# Patient Record
Sex: Male | Born: 1985 | Hispanic: Yes | Marital: Single | Smoking: Light tobacco smoker
Health system: Southern US, Community
[De-identification: ages and names within clinical notes are randomized; demographics above are authoritative.]

## PROBLEM LIST (undated history)

## (undated) DIAGNOSIS — F419 Anxiety disorder, unspecified: Secondary | ICD-10-CM

## (undated) DIAGNOSIS — F32A Depression, unspecified: Secondary | ICD-10-CM

## (undated) DIAGNOSIS — Z789 Other specified health status: Secondary | ICD-10-CM

## (undated) HISTORY — PX: OTHER SURGICAL HISTORY: SHX169

## (undated) HISTORY — DX: Other specified health status: Z78.9

---

## 2019-03-17 ENCOUNTER — Ambulatory Visit (INDEPENDENT_AMBULATORY_CARE_PROVIDER_SITE_OTHER): Payer: Self-pay | Admitting: Pharmacist

## 2019-03-17 ENCOUNTER — Telehealth: Payer: Self-pay | Admitting: Pharmacy Technician

## 2019-03-17 ENCOUNTER — Other Ambulatory Visit: Payer: Self-pay

## 2019-03-17 DIAGNOSIS — Z7252 High risk homosexual behavior: Secondary | ICD-10-CM

## 2019-03-17 NOTE — Telephone Encounter (Signed)
RC[facility] Patient Teacher, English as a foreign language completed.    The patient in uninsured and will need patient assistance for medication.  We can complete the application and will need to meet with the patient for signatures and income documentation.  Dylan Hurst. [name] Patient [facility] for Infectious Disease Phone: [phone] Fax:  [phone]

## 2019-03-17 NOTE — Progress Notes (Addendum)
Date:  03/17/2019   HPI: Dylan Hurst is a 33 y.o. male who presents to the [place] clinic to discuss and initiate PrEP.  Insured   '[]'    Uninsured  '[x]'    There are no active problems to display for this patient.   Patient's Medications   No medications on file    Allergies: Allergies not on file  Past Medical History: No past medical history on file.  Social History: Social History   Socioeconomic History  . Marital status: Single    Spouse name: Not on file  . Number of children: Not on file  . Years of education: Not on file  . Highest education level: Not on file  Occupational History  . Not on file  Social Needs  . Financial resource strain: Not on file  . Food insecurity:    Worry: Not on file    Inability: Not on file  . Transportation needs:    Medical: Not on file    Non-medical: Not on file  Tobacco Use  . Smoking status: Not on file  Substance and Sexual Activity  . Alcohol use: Not on file  . Drug use: Not on file  . Sexual activity: Not on file  Lifestyle  . Physical activity:    Days per week: Not on file    Minutes per session: Not on file  . Stress: Not on file  Relationships  . Social connections:    Talks on phone: Not on file    Gets together: Not on file    Attends religious service: Not on file    Active member of club or organization: Not on file    Attends meetings of clubs or organizations: Not on file    Relationship status: Not on file  Other Topics Concern  . Not on file  Social History Narrative  . Not on file    [facility] HIV PREP FLOWSHEET RESULTS 03/17/2019  Insurance Status Uninsured  How did you hear? Friends  Gender at birth Male  Gender identity cis-Male  Risk for HIV >5 partners in past 6 mos (regardless of condom use);Hx of STI;Condomless vaginal or anal intercourse  Sex Partners Men only  # sex partners past 3-6 mos >12  Sex activity preferences Receptive;Oral  Condom use Yes  % condom use 50  Treated  for STI? Yes  HIV symptoms? N/A  PrEP Eligibility Substantial risk for HIV    Labs:  SCr: No results found for: CREATININE HIV No results found for: HIV Hepatitis B No results found for: HEPBSAB, HEPBSAG, HEPBCAB Hepatitis C No results found for: HEPCAB, HCVRNAPCRQN Hepatitis A No results found for: HAV RPR and STI No results found for: LABRPR, RPRTITER  No flowsheet data found.  Assessment: Dylan Hurst is here today to discuss and initiate PrEP.  He states he knows little about PrEP at this time and heard about it from his friends.  He states that he sees the commercials on TV for Descovy. He is MSM who has had at least 20 partners in the last 6 months.  He only uses condoms about 50% of the time and is usually the receptive partner.  He engages in oral sex as well. He has been diagnosed with gonorrhea in the past but states that was about 10 years ago.  He has not been tested for gonorrhea since that time.  He also states that he bought an at-home kit to test for HIV a few months ago but before  then, he has not been tested in about 10 years. No history of IVDU or stimulant use.  No history of PEP.  He is not sure if any of his partners are HIV positive.  He definitely fits the criteria for needing PrEP since he is engaging in condomless receptive anal intercourse.  Will start Descovy for PrEP.  Counseled patient that Descovy is a one pill once daily regimen with or without food that can prevent HIV. Discussed the importance of taking the medication daily to provide protection and decreased adherence is associated with decreased efficacy. Also discussed how Descovy works to prevent HIV but not other STDs and encouraged the use of condoms. Counseled on what to do if dose is missed, if closer to missed dose take immediately, if closer to next dose then skip and resume normal schedule.  Counseled patient that Descovy is normally well tolerated, however some patients experience a "start up  syndrome" with nausea, diarrhea, dizziness, and fatigue but that those should resolve soon after starting.  Advised that any nausea can be mitigated by taking it with food. I reviewed patient medications and found no drug interactions. Discussed how our PrEP process works here at the clinic including follow ups and lab monitoring every 3 months.  He is interested in finding a PCP and someone who he can talk to about depression and anxiety.  He asked if Descovy would affect his mood and I told him that it should not.  He is not on any other medications at home and has no other diagnoses.  I will give him the contact information for [facility].  He is currently uninsured and not working.  I was able to get him a 30 day immediate approval from [place] and will send in the full application to get him approved for a year.  Will check all baseline labs today and start Descovy if he is HIV negative.  Will see him again in 3 months.  Plan: - HIV antibody, BMET, RPR, urine/oral/rectal gonorrhea/chlamydia cytology, hepatitis B surface antigen, hepatitis B surface antibody today - Fax application to [facility] for patient assistance - Descovy x 3 months if HIV negative - F/u with me 6/18 at [place]. Arionna Hoggard, PharmD, BCIDP, AAHIVP, [place] for Infectious Disease 03/17/2019, 3:01 PM

## 2019-03-17 NOTE — Patient Instructions (Signed)
[facility] [phone] 

## 2019-03-18 ENCOUNTER — Telehealth: Payer: Self-pay | Admitting: Pharmacy Technician

## 2019-03-18 ENCOUNTER — Telehealth: Payer: Self-pay | Admitting: Pharmacist

## 2019-03-18 DIAGNOSIS — Z7252 High risk homosexual behavior: Secondary | ICD-10-CM

## 2019-03-18 LAB — BASIC METABOLIC PANEL
BUN: 9 mg/dL (ref 7–25)
CHLORIDE: 105 mmol/L (ref 98–110)
CO2: 26 mmol/L (ref 20–32)
Calcium: 9.7 mg/dL (ref 8.6–10.3)
Creat: 0.97 mg/dL (ref 0.60–1.35)
Glucose, Bld: 81 mg/dL (ref 65–99)
POTASSIUM: 4.1 mmol/L (ref 3.5–5.3)
Sodium: 139 mmol/L (ref 135–146)

## 2019-03-18 LAB — URINE CYTOLOGY ANCILLARY ONLY
Chlamydia: NEGATIVE
Neisseria Gonorrhea: NEGATIVE

## 2019-03-18 LAB — HEPATITIS B SURFACE ANTIGEN: Hepatitis B Surface Ag: NONREACTIVE

## 2019-03-18 LAB — HIV ANTIBODY (ROUTINE TESTING W REFLEX): HIV 1&2 Ab, 4th Generation: NONREACTIVE

## 2019-03-18 LAB — CYTOLOGY, (ORAL, ANAL, URETHRAL) ANCILLARY ONLY
Chlamydia: NEGATIVE
Chlamydia: NEGATIVE
Neisseria Gonorrhea: NEGATIVE
Neisseria Gonorrhea: NEGATIVE

## 2019-03-18 LAB — HEPATITIS B SURFACE ANTIBODY,QUALITATIVE: Hep B S Ab: REACTIVE — AB

## 2019-03-18 LAB — RPR: RPR Ser Ql: NONREACTIVE

## 2019-03-18 MED ORDER — EMTRICITABINE-TENOFOVIR AF 200-25 MG PO TABS: 1.0000 | ORAL_TABLET | Freq: Every day | ORAL | 2 refills | Status: DC

## 2019-03-18 NOTE — Telephone Encounter (Signed)
Called patient to let him know that his HIV antibody was negative.  Will send in 3 months of Descovy and [facility] will mail it to his house.

## 2019-03-18 NOTE — Telephone Encounter (Signed)
[facility] Patient Advocate Encounter  Completed and sent [employer] Advancing Access application for Descovy for this patient who is uninsured.    Patient is approved 03/17/2019 through 03/16/2020.  BIN      [id number] PCN    [id number] GRP    [id number] ID        [id number]   Dylan Hurst Patient [facility] for Infectious Disease Phone: [phone] Fax:  [phone]

## 2019-06-10 ENCOUNTER — Ambulatory Visit (INDEPENDENT_AMBULATORY_CARE_PROVIDER_SITE_OTHER): Payer: Self-pay | Admitting: Pharmacist

## 2019-06-10 ENCOUNTER — Other Ambulatory Visit: Payer: Self-pay

## 2019-06-10 DIAGNOSIS — Z7252 High risk homosexual behavior: Secondary | ICD-10-CM

## 2019-06-10 NOTE — Progress Notes (Signed)
Date:  06/10/2019   HPI: Dylan Hurst is a 33 y.o. male who presents to the [place] clinic for 3 month PrEP follow-up.  Insured   []    Uninsured  [x]    There are no active problems to display for this patient.   Patient's Medications  New Prescriptions   No medications on file  Previous Medications   EMTRICITABINE-TENOFOVIR AF (DESCOVY) 200-25 MG TABLET    Take 1 tablet by mouth daily.  Modified Medications   No medications on file  Discontinued Medications   No medications on file    Allergies: Not on File  Past Medical History: No past medical history on file.  Social History: Social History   Socioeconomic History  . Marital status: Single    Spouse name: Not on file  . Number of children: Not on file  . Years of education: Not on file  . Highest education level: Not on file  Occupational History  . Not on file  Social Needs  . Financial resource strain: Not on file  . Food insecurity    Worry: Not on file    Inability: Not on file  . Transportation needs    Medical: Not on file    Non-medical: Not on file  Tobacco Use  . Smoking status: Not on file  Substance and Sexual Activity  . Alcohol use: Not on file  . Drug use: Not on file  . Sexual activity: Not on file  Lifestyle  . Physical activity    Days per week: Not on file    Minutes per session: Not on file  . Stress: Not on file  Relationships  . Social Herbalist on phone: Not on file    Gets together: Not on file    Attends religious service: Not on file    Active member of club or organization: Not on file    Attends meetings of clubs or organizations: Not on file    Relationship status: Not on file  Other Topics Concern  . Not on file  Social History Narrative  . Not on file    [facility] HIV PREP FLOWSHEET RESULTS 06/10/2019 03/17/2019  Insurance Status Uninsured Uninsured  How did you hear? - Friends  Gender at birth Male Male  Gender identity cis-Male cis-Male  Risk  for HIV - >5 partners in past 6 mos (regardless of condom use);Hx of STI;Condomless vaginal or anal intercourse  Sex Partners Men only Men only  # sex partners past 3-6 mos 1-3 >12  Sex activity preferences Insertive and receptive;Oral Receptive;Oral  Condom use No Yes  % condom use - 50  Treated for STI? No Yes  HIV symptoms? N/A N/A  PrEP Eligibility Substantial risk for HIV Substantial risk for HIV    Labs:  SCr: Lab Results  Component Value Date   CREATININE 0.97 03/17/2019   HIV Lab Results  Component Value Date   HIV NON-REACTIVE 03/17/2019   Hepatitis B Lab Results  Component Value Date   HEPBSAB REACTIVE (A) 03/17/2019   HEPBSAG NON-REACTIVE 03/17/2019   Hepatitis C No results found for: HEPCAB, HCVRNAPCRQN Hepatitis A No results found for: HAV RPR and STI Lab Results  Component Value Date   LABRPR NON-REACTIVE 03/17/2019    STI Results GC CT  03/17/2019 Negative Negative  03/17/2019 Negative Negative  03/17/2019 Negative Negative    Assessment: Dylan Hurst is here today for his 3 month PrEP follow-up and lab appointment. He started Descovy back  in March and has tolerated it very well.  He had questions on whether it was working or not due to him not feeling any different.  I told him that was a good thing that he was tolerating it without issues.  He has only missed one dose since starting 3 months ago and takes it around the same time every day. He remains uninsured and has no issues getting it from [facility]  He has only had 2 partners (previously 20+) in the last 3 months with no condom use. They are not new partners and they get tested as well. No issues or concerns for STDs or acute HIV today. Will check a HIV antibody today and see him back in 3 months.  Plan: - HIV antibody only - Descovy x 3 months if HIV negative - F/u with me again 9/16 at 230pm  Cassie L. Kuppelweiser, PharmD, BCIDP, AAHIVP, [place] for Infectious Disease 06/10/2019, 2:48 PM

## 2019-06-10 NOTE — Patient Instructions (Signed)
[facility] [phone]

## 2019-06-11 ENCOUNTER — Telehealth: Payer: Self-pay | Admitting: Pharmacist

## 2019-06-11 ENCOUNTER — Ambulatory Visit: Payer: Self-pay | Admitting: Pharmacist

## 2019-06-11 ENCOUNTER — Other Ambulatory Visit: Payer: Self-pay | Admitting: Pharmacist

## 2019-06-11 DIAGNOSIS — Z7252 High risk homosexual behavior: Secondary | ICD-10-CM

## 2019-06-11 LAB — HIV ANTIBODY (ROUTINE TESTING W REFLEX): HIV 1&2 Ab, 4th Generation: NONREACTIVE

## 2019-06-11 MED ORDER — DESCOVY 200-25 MG PO TABS: 1.0000 | ORAL_TABLET | Freq: Every day | ORAL | 2 refills | Status: DC

## 2019-06-11 NOTE — Telephone Encounter (Signed)
Called patient to let him know that his HIV antibody was negative - no answer and no VM set up.  Will send in 3 more months of Descovy. He will have it shipped from [facility].

## 2019-09-09 ENCOUNTER — Ambulatory Visit: Payer: Self-pay | Admitting: Pharmacist

## 2019-11-10 ENCOUNTER — Other Ambulatory Visit: Payer: Self-pay

## 2019-11-10 ENCOUNTER — Ambulatory Visit (INDEPENDENT_AMBULATORY_CARE_PROVIDER_SITE_OTHER): Payer: Self-pay | Admitting: Pharmacist

## 2019-11-10 DIAGNOSIS — Z7252 High risk homosexual behavior: Secondary | ICD-10-CM

## 2019-11-10 NOTE — Progress Notes (Signed)
Date:  11/10/2019   HPI: Dylan Hurst is a 33 y.o. male who presents to the [place] clinic for 3 month PrEP follow-up.  Insured   []    Uninsured  [x]    There are no active problems to display for this patient.   Patient's Medications  New Prescriptions   No medications on file  Previous Medications   EMTRICITABINE-TENOFOVIR AF (DESCOVY) 200-25 MG TABLET    Take 1 tablet by mouth daily.  Modified Medications   No medications on file  Discontinued Medications   No medications on file    Allergies: Not on File  Past Medical History: No past medical history on file.  Social History: Social History   Socioeconomic History  . Marital status: Single    Spouse name: Not on file  . Number of children: Not on file  . Years of education: Not on file  . Highest education level: Not on file  Occupational History  . Not on file  Social Needs  . Financial resource strain: Not on file  . Food insecurity    Worry: Not on file    Inability: Not on file  . Transportation needs    Medical: Not on file    Non-medical: Not on file  Tobacco Use  . Smoking status: Not on file  Substance and Sexual Activity  . Alcohol use: Not on file  . Drug use: Not on file  . Sexual activity: Not on file  Lifestyle  . Physical activity    Days per week: Not on file    Minutes per session: Not on file  . Stress: Not on file  Relationships  . Social Herbalist on phone: Not on file    Gets together: Not on file    Attends religious service: Not on file    Active member of club or organization: Not on file    Attends meetings of clubs or organizations: Not on file    Relationship status: Not on file  Other Topics Concern  . Not on file  Social History Narrative  . Not on file    [facility] HIV PREP FLOWSHEET RESULTS 06/10/2019 03/17/2019  Insurance Status Uninsured Uninsured  How did you hear? - Friends  Gender at birth Male Male  Gender identity cis-Male cis-Male  Risk  for HIV - >5 partners in past 6 mos (regardless of condom use);Hx of STI;Condomless vaginal or anal intercourse  Sex Partners Men only Men only  # sex partners past 3-6 mos 1-3 >12  Sex activity preferences Insertive and receptive;Oral Receptive;Oral  Condom use No Yes  % condom use - 50  Treated for STI? No Yes  HIV symptoms? N/A N/A  PrEP Eligibility Substantial risk for HIV Substantial risk for HIV    Labs:  SCr: Lab Results  Component Value Date   CREATININE 0.97 03/17/2019   HIV Lab Results  Component Value Date   HIV NON-REACTIVE 06/10/2019   HIV NON-REACTIVE 03/17/2019   Hepatitis B Lab Results  Component Value Date   HEPBSAB REACTIVE (A) 03/17/2019   HEPBSAG NON-REACTIVE 03/17/2019   Hepatitis C No results found for: HEPCAB, HCVRNAPCRQN Hepatitis A No results found for: HAV RPR and STI Lab Results  Component Value Date   LABRPR NON-REACTIVE 03/17/2019    STI Results GC CT  03/17/2019 Negative Negative  03/17/2019 Negative Negative  03/17/2019 Negative Negative    Assessment: Dylan Hurst is here in clinic today to follow up with PrEP after  being out of care for a few months. He was started on Descovy back in March of 2020 and says he has had no trouble taking the medication. He ran out of PrEP in October and has had trouble getting to his clinic appointments since so he has been off Dylan Hurst for a few months.   He has had 8 partners in the past 3-6 months and he has used protection since running out of his PrEP but was not always using condoms prior to that. I told him that we do recommend he use condoms all the time because although the Descovy does prevent HIV infection if he were to be exposed, it does not protect against STDs. He reports being the receptive partner in most of his recent sexual encounters but he has been insertive in the past. We will check a urine cytology if he can provide a sample, as well as oral and rectal cytology. I have scheduled his three  month follow up, and will give him a call tomorrow with the results of his HIV antibody.    Plan: - Continue Descovy if HIV antibody negative - RPR, BMET, HIV antibody, oral/rectal cytology - Follow up 02/05/2020   Nicoletta Dress, PharmD PGY2 Infectious Disease Pharmacy Resident  [place] for Infectious Disease 11/10/2019, 1:46 PM

## 2019-11-11 ENCOUNTER — Other Ambulatory Visit: Payer: Self-pay | Admitting: Pharmacist

## 2019-11-11 DIAGNOSIS — Z7252 High risk homosexual behavior: Secondary | ICD-10-CM

## 2019-11-11 LAB — CYTOLOGY, (ORAL, ANAL, URETHRAL) ANCILLARY ONLY
Chlamydia: NEGATIVE
Chlamydia: NEGATIVE
Comment: NEGATIVE
Comment: NEGATIVE
Comment: NORMAL
Comment: NORMAL
Neisseria Gonorrhea: NEGATIVE
Neisseria Gonorrhea: NEGATIVE

## 2019-11-11 LAB — BASIC METABOLIC PANEL
BUN: 10 mg/dL (ref 7–25)
CO2: 23 mmol/L (ref 20–32)
Calcium: 9.8 mg/dL (ref 8.6–10.3)
Chloride: 105 mmol/L (ref 98–110)
Creat: 0.92 mg/dL (ref 0.60–1.35)
Glucose, Bld: 100 mg/dL — ABNORMAL HIGH (ref 65–99)
Potassium: 4.1 mmol/L (ref 3.5–5.3)
Sodium: 139 mmol/L (ref 135–146)

## 2019-11-11 LAB — HIV ANTIBODY (ROUTINE TESTING W REFLEX): HIV 1&2 Ab, 4th Generation: NONREACTIVE

## 2019-11-11 LAB — RPR: RPR Ser Ql: NONREACTIVE

## 2019-11-11 MED ORDER — DESCOVY 200-25 MG PO TABS: 1.0000 | ORAL_TABLET | Freq: Every day | ORAL | 2 refills | Status: DC

## 2019-11-11 NOTE — Progress Notes (Unsigned)
Patients HIV antibody came back negative, will send in a refill for Descovy to [facility]

## 2020-01-05 ENCOUNTER — Other Ambulatory Visit: Payer: Self-pay | Admitting: Pharmacist

## 2020-01-05 DIAGNOSIS — Z7252 High risk homosexual behavior: Secondary | ICD-10-CM

## 2020-02-05 ENCOUNTER — Other Ambulatory Visit: Payer: Self-pay

## 2020-02-05 ENCOUNTER — Ambulatory Visit: Payer: Self-pay | Admitting: Pharmacist

## 2020-02-25 ENCOUNTER — Ambulatory Visit: Payer: Self-pay | Admitting: Pharmacist

## 2020-02-29 ENCOUNTER — Ambulatory Visit (INDEPENDENT_AMBULATORY_CARE_PROVIDER_SITE_OTHER): Payer: Self-pay | Admitting: Pharmacist

## 2020-02-29 ENCOUNTER — Other Ambulatory Visit: Payer: Self-pay

## 2020-02-29 DIAGNOSIS — Z79899 Other long term (current) drug therapy: Secondary | ICD-10-CM

## 2020-02-29 DIAGNOSIS — Z7252 High risk homosexual behavior: Secondary | ICD-10-CM

## 2020-02-29 NOTE — Patient Instructions (Addendum)
[place] [phone]

## 2020-02-29 NOTE — Progress Notes (Signed)
Date:  02/29/2020   HPI: Jasher Klym is a 34 y.o. male who presents to the [place] clinic for 3 month PrEP follow-up.  Insured   []    Uninsured  [x]    There are no problems to display for this patient.   Patient's Medications  New Prescriptions   No medications on file  Previous Medications   EMTRICITABINE-TENOFOVIR AF (DESCOVY) 200-25 MG TABLET    Take 1 tablet by mouth daily.  Modified Medications   No medications on file  Discontinued Medications   No medications on file    Allergies: Not on File  Past Medical History: No past medical history on file.  Social History: Social History   Socioeconomic History  . Marital status: Single    Spouse name: Not on file  . Number of children: Not on file  . Years of education: Not on file  . Highest education level: Not on file  Occupational History  . Not on file  Tobacco Use  . Smoking status: Not on file  Substance and Sexual Activity  . Alcohol use: Not on file  . Drug use: Not on file  . Sexual activity: Not on file  Other Topics Concern  . Not on file  Social History Narrative  . Not on file   Social Determinants of Health   Financial Resource Strain:   . Difficulty of Paying Living Expenses: Not on file  Food Insecurity:   . Worried About Charity fundraiser in the Last Year: Not on file  . Ran Out of Food in the Last Year: Not on file  Transportation Needs:   . Lack of Transportation (Medical): Not on file  . Lack of Transportation (Non-Medical): Not on file  Physical Activity:   . Days of Exercise per Week: Not on file  . Minutes of Exercise per Session: Not on file  Stress:   . Feeling of Stress : Not on file  Social Connections:   . Frequency of Communication with Friends and Family: Not on file  . Frequency of Social Gatherings with Friends and Family: Not on file  . Attends Religious Services: Not on file  . Active Member of Clubs or Organizations: Not on file  . Attends Theatre manager Meetings: Not on file  . Marital Status: Not on file    [facility] HIV PREP FLOWSHEET RESULTS 06/10/2019 03/17/2019  Insurance Status Uninsured Uninsured  How did you hear? - Friends  Gender at birth Male Male  Gender identity cis-Male cis-Male  Risk for HIV - >5 partners in past 6 mos (regardless of condom use);Hx of STI;Condomless vaginal or anal intercourse  Sex Partners Men only Men only  # sex partners past 3-6 mos 1-3 >12  Sex activity preferences Insertive and receptive;Oral Receptive;Oral  Condom use No Yes  % condom use - 50  Treated for STI? No Yes  HIV symptoms? N/A N/A  PrEP Eligibility Substantial risk for HIV Substantial risk for HIV    Labs:  SCr: Lab Results  Component Value Date   CREATININE 0.92 11/10/2019   CREATININE 0.97 03/17/2019   HIV Lab Results  Component Value Date   HIV NON-REACTIVE 11/10/2019   HIV NON-REACTIVE 06/10/2019   HIV NON-REACTIVE 03/17/2019   Hepatitis B Lab Results  Component Value Date   HEPBSAB REACTIVE (A) 03/17/2019   HEPBSAG NON-REACTIVE 03/17/2019   Hepatitis C No results found for: HEPCAB, HCVRNAPCRQN Hepatitis A No results found for: HAV RPR and STI Lab  Results  Component Value Date   LABRPR NON-REACTIVE 11/10/2019   LABRPR NON-REACTIVE 03/17/2019    STI Results GC CT  11/10/2019 Negative Negative  11/10/2019 Negative Negative  03/17/2019 Negative Negative  03/17/2019 Negative Negative  03/17/2019 Negative Negative    Assessment: CD is here today for Descovy  PrEP follow up. He mentions issues with currently untreated depression/anxiety and other social determinants of health (transportation, employment).   CD currently has 5 consistent sexual partners and does have unprotected sex but states he is not concerned for STI's at this time.   Plan: 1) HIV Ab test 2) 90 day refill for Descovy if HIV negative 3) F/U in 3 months in June 2021 4) Refer to [place] clinic for  depression/anxiety   Ned Clines Student Pharmacist, Class of [place] for Infectious Disease 02/29/2020, 3:06 PM

## 2020-03-01 ENCOUNTER — Other Ambulatory Visit: Payer: Self-pay | Admitting: Pharmacist

## 2020-03-01 DIAGNOSIS — Z7252 High risk homosexual behavior: Secondary | ICD-10-CM

## 2020-03-01 LAB — HIV ANTIBODY (ROUTINE TESTING W REFLEX): HIV 1&2 Ab, 4th Generation: NONREACTIVE

## 2020-03-01 MED ORDER — DESCOVY 200-25 MG PO TABS: 1.0000 | ORAL_TABLET | Freq: Every day | ORAL | 2 refills | Status: DC

## 2020-03-01 NOTE — Progress Notes (Signed)
Patient's HIV antibody is negative.  Will send in 3 more months of Descovy to [facility].  

## 2020-03-14 ENCOUNTER — Other Ambulatory Visit: Payer: Self-pay

## 2020-03-14 ENCOUNTER — Ambulatory Visit: Payer: Self-pay | Attending: Family Medicine | Admitting: Family Medicine

## 2020-03-14 ENCOUNTER — Encounter: Payer: Self-pay | Admitting: Family Medicine

## 2020-03-14 VITALS — Ht 66.0 in

## 2020-03-14 DIAGNOSIS — H5789 Other specified disorders of eye and adnexa: Secondary | ICD-10-CM

## 2020-03-14 DIAGNOSIS — G479 Sleep disorder, unspecified: Secondary | ICD-10-CM

## 2020-03-14 DIAGNOSIS — R4589 Other symptoms and signs involving emotional state: Secondary | ICD-10-CM

## 2020-03-14 DIAGNOSIS — F411 Generalized anxiety disorder: Secondary | ICD-10-CM

## 2020-03-14 MED ORDER — TRAZODONE HCL 50 MG PO TABS: 25.0000 mg | ORAL_TABLET | Freq: Every evening | ORAL | 2 refills | Status: DC | PRN

## 2020-03-14 MED ORDER — VENLAFAXINE HCL ER 75 MG PO CP24: 75.0000 mg | ORAL_CAPSULE | Freq: Every day | ORAL | 2 refills | Status: DC

## 2020-03-14 NOTE — Progress Notes (Signed)
Virtual Visit via Telephone Note  I connected with Dylan Hurst on 03/14/20 at  3:30 PM EDT by telephone and verified that I am speaking with the correct person using two identifiers.   I discussed the limitations, risks, security and privacy concerns of performing an evaluation and management service by telephone and the availability of in person appointments. I also discussed with the patient that there may be a patient responsible charge related to this service. The patient expressed understanding and agreed to proceed.  Patient Location: Home Provider Location: [facility] Others participating in call: none   History of Present Illness:          34 year old male new to the practice who reports that he has sensation of something being in his left eye.  He reports that his eye at times feels irritated.  He denies any drainage or redness of the eye.  Occasionally his eye feels watery.  He denies any changes in vision.  Upon further discussion, patient states that about 2 years ago he was hit in the eye with a soccer ball and since that time he has felt that there is an abnormal appearance as if the eyes still slightly swollen as well as the occasional sensation that something is in his eye.  He reports that he did have an evaluation and was told that there were no fractures or other abnormalities causing his abnormal eye sensation.           He also reports that he has had anxiety for about 5 years in which he would have difficulty being in crowded areas as this would cause him to feel anxious and sometimes he would just have onset of sensation of anxiety from no known source.  Over the past year, he has felt that he is getting the onset of depression as he has a lack of motivation to do anything such as cleaning up his room or doing chores and he finds that he mostly stays in his room during the day as he does not really want to interact with other people.  He does currently live with his mother.   He is also having issues with both falling asleep and staying asleep.  He reports that his sleep issues have also been somewhat longstanding.  He denies any current issues with headaches or dizziness, no changes in vision, no focal numbness or weakness, no chest pain or palpitations, no shortness of breath or cough.  No abdominal pain-no nausea/vomiting/diarrhea or constipation.  No suicidal thoughts or ideations.  He denies any past counseling or past use of medication to help with anxiety or depression but would be willing to start counseling as well as medication.          Past Medical History:  Diagnosis Date  . Known health problems: none     Past Surgical History:  Procedure Laterality Date  . no past surgery      Family History  Problem Relation Age of Onset  . Thyroid disease Mother   . Diabetes Father     Social History   Tobacco Use  . Smoking status: Never Smoker  . Smokeless tobacco: Never Used  Substance Use Topics  . Alcohol use: Not Currently  . Drug use: Yes    Types: Marijuana     No Known Allergies     Observations/Objective: No vital signs or physical exam conducted as visit was done via telephone  Assessment and Plan: 1. GAD (generalized anxiety disorder)  2. Depressed mood He reports that he has had anxiety for at least 5 years which consist of not wanting to be around large groups of people at avoiding situations that cause him to feel anxious however over the past year with the COVID-19 pandemic he feels that he has also developed some depression and lack of motivation.  He denies any suicidal thoughts or ideations.  He also has abnormal PHQ-9 score.  He agrees to start medication to help with anxiety/depression and lack of motivation, venlafaxine/Effexor 75 mg once daily and he is aware that he needs to take the medication early in the day so that it does not cause issues with his sleep.  He also agrees to be contacted by the medical social worker for  counseling and if needed to help set up ongoing counseling regarding his anxiety and depression.  He is made aware that it may take up to 4 weeks for him to feel as well on the medication as he is going to fail and he is encouraged to call or notify the office if he has any difficulty tolerating the medication.  He should seek follow-up at the emergency department if he has any acute worsening of anxiety or depression, suicidal thoughts or ideations or any other concerns. - venlafaxine XR (EFFEXOR-XR) 75 MG 24 hr capsule; Take 1 capsule (75 mg total) by mouth daily with breakfast.  Dispense: 30 capsule; Refill: 2 - Ambulatory referral to Social Work  3. Sensation of irritation of globe of eye Patient reports that he was previously hit in the eye by a soccer ball about 2 years ago and since that time he has recurrent abnormal sensation in his eye and also feels as if his eyes slightly enlarged.  He reports that he did have evaluation which did not find any facial fracture or other abnormality.  4. Difficulty sleeping He reports that he has difficulty falling asleep and staying asleep.  Will provide patient with prescription for trazodone 50 mg to take half or 1 pill at bedtime as needed to help with sleep. - traZODone (DESYREL) 50 MG tablet; Take 0.5-1 tablets (25-50 mg total) by mouth at bedtime as needed for sleep.  Dispense: 30 tablet; Refill: 2  Follow Up Instructions:Return in about 4 weeks (around 04/11/2020) for anxiety/depression-sooner if needed.    I discussed the assessment and treatment plan with the patient. The patient was provided an opportunity to ask questions and all were answered. The patient agreed with the plan and demonstrated an understanding of the instructions.   The patient was advised to call back or seek an in-person evaluation if the symptoms worsen or if the condition fails to improve as anticipated.  I provided 15 minutes of non-face-to-face time during this  encounter.   Antony Blackbird, MD

## 2020-03-14 NOTE — Progress Notes (Signed)
New Pt  Per pt she is having issues with depression and anxiety

## 2020-03-21 ENCOUNTER — Telehealth: Payer: Self-pay | Admitting: Pharmacy Technician

## 2020-03-21 NOTE — Telephone Encounter (Signed)
[facility] Patient Advocate Encounter  Completed and sent [employer] Advancing Access application for Descovy for this patient who is uninsured.  This is for reenrollment.  Patient assistance phone number for follow up is [phone].   This encounter will be updated until final determination.   Dylan Hurst. [name] Patient [facility] Phone: [phone] Fax:  [phone]

## 2020-03-23 ENCOUNTER — Telehealth: Payer: Self-pay | Admitting: Licensed Clinical Social Worker

## 2020-03-23 NOTE — Telephone Encounter (Signed)
[facility] Patient Advocate Encounter  Received a fax in reference to below information. [employer] faxed a determination stating that the patient is not eligible because of Medicaid status. I checked [place] [employer] and could not find coverage, spoke with the patient and he confirmed he does not have [place] medicaid.  Will communicate this finding with [employer] and continue to follow.

## 2020-03-23 NOTE — Telephone Encounter (Signed)
Call placed to patient utilizing Dylan Hurst [phone]) Message requesting a return call was left.

## 2020-03-30 ENCOUNTER — Telehealth: Payer: Self-pay | Admitting: Pharmacy Technician

## 2020-03-30 NOTE — Telephone Encounter (Signed)
[facility] Patient Advocate Encounter  Called Advancing Access and explained that his Medicaid was through family planning.  Patient is approved 03/30/2020 for up to 12 months.  BIN      [id number] PCN    [id number] [id number]    [id number] ID        [id number]   Inez Catalina E. Nadara Mustard [place] Patient [facility] Phone: [phone] Fax:  [phone]

## 2020-04-13 ENCOUNTER — Telehealth: Payer: Self-pay | Admitting: Licensed Clinical Social Worker

## 2020-04-13 NOTE — Telephone Encounter (Signed)
Call placed to patient utilizing [place] Dylan Hurst [phone]) Voicemail full; therefore, unable to leave message

## 2020-05-26 ENCOUNTER — Other Ambulatory Visit: Payer: Self-pay | Admitting: Physician Assistant

## 2020-05-26 ENCOUNTER — Ambulatory Visit (INDEPENDENT_AMBULATORY_CARE_PROVIDER_SITE_OTHER): Payer: Self-pay | Admitting: Pharmacist

## 2020-05-26 ENCOUNTER — Other Ambulatory Visit: Payer: Self-pay

## 2020-05-26 ENCOUNTER — Ambulatory Visit: Payer: Self-pay | Attending: Family Medicine | Admitting: Physician Assistant

## 2020-05-26 VITALS — BP 117/75 | HR 74 | Temp 97.7°F | Ht 66.0 in | Wt 148.0 lb

## 2020-05-26 DIAGNOSIS — Z79899 Other long term (current) drug therapy: Secondary | ICD-10-CM

## 2020-05-26 DIAGNOSIS — R5383 Other fatigue: Secondary | ICD-10-CM

## 2020-05-26 DIAGNOSIS — H02843 Edema of right eye, unspecified eyelid: Secondary | ICD-10-CM

## 2020-05-26 DIAGNOSIS — F329 Major depressive disorder, single episode, unspecified: Secondary | ICD-10-CM

## 2020-05-26 DIAGNOSIS — F419 Anxiety disorder, unspecified: Secondary | ICD-10-CM

## 2020-05-26 DIAGNOSIS — Z113 Encounter for screening for infections with a predominantly sexual mode of transmission: Secondary | ICD-10-CM

## 2020-05-26 DIAGNOSIS — F32A Depression, unspecified: Secondary | ICD-10-CM

## 2020-05-26 MED ORDER — SERTRALINE HCL 50 MG PO TABS: 50.0000 mg | ORAL_TABLET | Freq: Every day | ORAL | 3 refills | Status: DC

## 2020-05-26 NOTE — Addendum Note (Signed)
Addended byMariane Baumgarten on: 05/26/2020 04:13 PM   Modules accepted: Orders

## 2020-05-26 NOTE — Patient Instructions (Signed)
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
## 2020-05-26 NOTE — Progress Notes (Signed)
Date:  05/26/2020   HPI: Dylan Hurst is a 34 y.o. male who presents to the [place] clinic for 3 month PrEP follow-up.  Insured   []    Uninsured  []    There are no problems to display for this patient.   Patient's Medications  New Prescriptions   No medications on file  Previous Medications   EMTRICITABINE-TENOFOVIR AF (DESCOVY) 200-25 MG TABLET    Take 1 tablet by mouth daily.   SERTRALINE (ZOLOFT) 50 MG TABLET    Take 1 tablet (50 mg total) by mouth daily.  Modified Medications   No medications on file  Discontinued Medications   No medications on file    Allergies: No Known Allergies  Past Medical History: Past Medical History:  Diagnosis Date  . Known health problems: none     Social History: Social History   Socioeconomic History  . Marital status: Single    Spouse name: Not on file  . Number of children: Not on file  . Years of education: Not on file  . Highest education level: Not on file  Occupational History  . Not on file  Tobacco Use  . Smoking status: Never Smoker  . Smokeless tobacco: Never Used  Substance and Sexual Activity  . Alcohol use: Not Currently  . Drug use: Yes    Types: Marijuana  . Sexual activity: Not on file  Other Topics Concern  . Not on file  Social History Narrative  . Not on file   Social Determinants of Health   Financial Resource Strain:   . Difficulty of Paying Living Expenses:   Food Insecurity:   . Worried About Charity fundraiser in the Last Year:   . Arboriculturist in the Last Year:   Transportation Needs:   . Film/video editor (Medical):   Marland Kitchen Lack of Transportation (Non-Medical):   Physical Activity:   . Days of Exercise per Week:   . Minutes of Exercise per Session:   Stress:   . Feeling of Stress :   Social Connections:   . Frequency of Communication with Friends and Family:   . Frequency of Social Gatherings with Friends and Family:   . Attends Religious Services:   . Active Member of  Clubs or Organizations:   . Attends Archivist Meetings:   Marland Kitchen Marital Status:     [facility] HIV PREP FLOWSHEET RESULTS 06/10/2019 03/17/2019  Insurance Status Uninsured Uninsured  How did you hear? - Friends  Gender at birth Male Male  Gender identity cis-Male cis-Male  Risk for HIV - >5 partners in past 6 mos (regardless of condom use);Hx of STI;Condomless vaginal or anal intercourse  Sex Partners Men only Men only  # sex partners past 3-6 mos 1-3 >12  Sex activity preferences Insertive and receptive;Oral Receptive;Oral  Condom use No Yes  % condom use - 50  Treated for STI? No Yes  HIV symptoms? N/A N/A  PrEP Eligibility Substantial risk for HIV Substantial risk for HIV    Labs:  SCr: Lab Results  Component Value Date   CREATININE 0.92 11/10/2019   CREATININE 0.97 03/17/2019   HIV Lab Results  Component Value Date   HIV NON-REACTIVE 02/29/2020   HIV NON-REACTIVE 11/10/2019   HIV NON-REACTIVE 06/10/2019   HIV NON-REACTIVE 03/17/2019   Hepatitis B Lab Results  Component Value Date   HEPBSAB REACTIVE (A) 03/17/2019   HEPBSAG NON-REACTIVE 03/17/2019   Hepatitis C No results found for: HEPCAB,  HCVRNAPCRQN Hepatitis A No results found for: HAV RPR and STI Lab Results  Component Value Date   LABRPR NON-REACTIVE 11/10/2019   LABRPR NON-REACTIVE 03/17/2019    STI Results GC CT  11/10/2019 Negative Negative  11/10/2019 Negative Negative  03/17/2019 Negative Negative  03/17/2019 Negative Negative  03/17/2019 Negative Negative    Assessment: Dylan Hurst is here today for his 3 month PrEP follow up appointment. He is doing well on Descovy with no side effects or issues getting it from the pharmacy. He does admit to missing 2 weeks last month due to a depressive episode.  He was able to connect with [facility] and started on a new anti-depressant that made him feel worse. He states that he just laid in bed and was depressed. He did have thoughts of  harming himself but also states that he would never go through with it because of his mother. He told me that he knows to reach out if he has those overwhelming thoughts. He is going to [facility] after this appointment to see them.  He has had 2 new partners since we saw him without condom use. Will update STD check today as well as other routine PrEP labs and see him back in 3 months.  Plan:  - HIV antibody, BMET, RPR, urine/oral/rectal gonorrhea/chlamydia cytology today - Descovy x 3 months if HIV negative - F/u in 3 months  Dylan Hurst L. Braxdon Gappa, PharmD, BCIDP, AAHIVP, CPP Clinical Pharmacist Practitioner Infectious Diseases [place] [facility] 05/26/2020, 2:44 PM

## 2020-05-27 ENCOUNTER — Encounter: Payer: Self-pay | Admitting: Pharmacist

## 2020-05-27 ENCOUNTER — Other Ambulatory Visit: Payer: Self-pay | Admitting: Pharmacist

## 2020-05-27 DIAGNOSIS — Z7252 High risk homosexual behavior: Secondary | ICD-10-CM

## 2020-05-27 LAB — BASIC METABOLIC PANEL
BUN: 10 mg/dL (ref 7–25)
CO2: 26 mmol/L (ref 20–32)
Calcium: 9.7 mg/dL (ref 8.6–10.3)
Chloride: 102 mmol/L (ref 98–110)
Creat: 0.84 mg/dL (ref 0.60–1.35)
Glucose, Bld: 95 mg/dL (ref 65–99)
Potassium: 4.7 mmol/L (ref 3.5–5.3)
Sodium: 139 mmol/L (ref 135–146)

## 2020-05-27 LAB — URINE CYTOLOGY ANCILLARY ONLY
Chlamydia: NEGATIVE
Comment: NEGATIVE
Comment: NORMAL
Neisseria Gonorrhea: NEGATIVE

## 2020-05-27 LAB — CYTOLOGY, (ORAL, ANAL, URETHRAL) ANCILLARY ONLY
Chlamydia: NEGATIVE
Chlamydia: NEGATIVE
Comment: NEGATIVE
Comment: NEGATIVE
Comment: NORMAL
Comment: NORMAL
Neisseria Gonorrhea: NEGATIVE
Neisseria Gonorrhea: NEGATIVE

## 2020-05-27 LAB — RPR: RPR Ser Ql: NONREACTIVE

## 2020-05-27 LAB — HIV ANTIBODY (ROUTINE TESTING W REFLEX): HIV 1&2 Ab, 4th Generation: NONREACTIVE

## 2020-05-27 MED ORDER — DESCOVY 200-25 MG PO TABS: 1.0000 | ORAL_TABLET | Freq: Every day | ORAL | 2 refills | Status: DC

## 2020-05-27 NOTE — Progress Notes (Signed)
Patient's HIV antibody is negative.  Will send in 3 more months of Descovy to [facility].  

## 2020-05-28 LAB — CBC WITH DIFFERENTIAL/PLATELET
Basophils Absolute: 0.1 10*3/uL (ref 0.0–0.2)
Basos: 1 %
EOS (ABSOLUTE): 0.5 10*3/uL — ABNORMAL HIGH (ref 0.0–0.4)
Eos: 6 %
Hematocrit: 44.1 % (ref 37.5–51.0)
Hemoglobin: 14.4 g/dL (ref 13.0–17.7)
Immature Grans (Abs): 0 10*3/uL (ref 0.0–0.1)
Immature Granulocytes: 0 %
Lymphocytes Absolute: 2.9 10*3/uL (ref 0.7–3.1)
Lymphs: 34 %
MCH: 28.9 pg (ref 26.6–33.0)
MCHC: 32.7 g/dL (ref 31.5–35.7)
MCV: 88 fL (ref 79–97)
Monocytes Absolute: 0.8 10*3/uL (ref 0.1–0.9)
Monocytes: 9 %
Neutrophils Absolute: 4.2 10*3/uL (ref 1.4–7.0)
Neutrophils: 50 %
Platelets: 330 10*3/uL (ref 150–450)
RBC: 4.99 x10E6/uL (ref 4.14–5.80)
RDW: 13.1 % (ref 11.6–15.4)
WBC: 8.5 10*3/uL (ref 3.4–10.8)

## 2020-05-28 LAB — TSH: TSH: 2.24 u[IU]/mL (ref 0.450–4.500)

## 2020-05-28 LAB — TESTOSTERONE,FREE AND TOTAL
Testosterone, Free: 11.5 pg/mL (ref 8.7–25.1)
Testosterone: 604 ng/dL (ref 264–916)

## 2020-05-28 LAB — VITAMIN D 25 HYDROXY (VIT D DEFICIENCY, FRACTURES): Vit D, 25-Hydroxy: 28.7 ng/mL — ABNORMAL LOW (ref 30.0–100.0)

## 2020-07-04 ENCOUNTER — Ambulatory Visit: Payer: Self-pay | Admitting: Family Medicine

## 2020-07-04 ENCOUNTER — Other Ambulatory Visit: Payer: Self-pay

## 2020-07-04 ENCOUNTER — Ambulatory Visit: Payer: Self-pay | Attending: Nurse Practitioner | Admitting: Nurse Practitioner

## 2020-07-07 ENCOUNTER — Telehealth: Payer: Self-pay | Admitting: Licensed Clinical Social Worker

## 2020-07-07 NOTE — Telephone Encounter (Signed)
Call placed to patient regarding [facility] referral. Mailbox is full and can not accept any messages at this time

## 2020-08-04 ENCOUNTER — Ambulatory Visit: Payer: Self-pay | Attending: Family Medicine | Admitting: Licensed Clinical Social Worker

## 2020-08-04 DIAGNOSIS — F411 Generalized anxiety disorder: Secondary | ICD-10-CM

## 2020-08-04 NOTE — BH Specialist Note (Signed)
Integrated Behavioral Health Visit via Telemedicine (Telephone)  08/04/2020 Dylan Hurst [id number]   Session Start time: 9:00 AM  Session End time: 9:20 AM Total time: 20 minutes  Referring Provider: Weyman Pedro Type of Visit: Telephonic Patient location: Home [facility] Provider location: Office All persons participating in visit: LCSW and Patient   Discussed confidentiality: Yes   "By engaging in this telephone visit, you consent to the provision of healthcare.  Additionally, you authorize for your insurance to be billed for the services provided during this telephone visit."   Patient and/or legal guardian consented to telephone visit: Yes   PRESENTING CONCERNS: Patient and/or family reports the following symptoms/concerns: Pt reports difficulty managing anxiety and depression symptoms, including, worry, heart palpitations, withdrawn behavior, and low motivation/energy Pt is taking meds appropriately feels a little better Duration of problem: "last year"; Severity of problem: moderate  STRENGTHS (Protective Factors/Coping Skills): Pt identified mother as protective factor and a strong support   GOALS ADDRESSED: Patient will: 1.  Reduce symptoms of: anxiety and depression  2.  Increase knowledge and/or ability of: coping skills  3.  Demonstrate ability to: Increase healthy adjustment to current life circumstances and Increase adequate support systems for patient/family  INTERVENTIONS: Interventions utilized:  Mindfulness or Psychologist, educational, Supportive Counseling and Psychoeducation and/or Health Education Standardized Assessments completed: Not Needed  ASSESSMENT: Patient currently experiencing symptoms of depression and anxiety triggered by psychosocial stressors.   Patient may benefit from brief therapy and continued medication management. LCSW discussed correlation between one's physical and mental health, in addition, to how stress can negatively impact both. Healthy  coping skills (ie soccer) discussed to assist with management and/or decrease of symptoms.  PLAN: 1. Follow up with behavioral health clinician on : 08/11/2020 2. Behavioral recommendations: Utilize strategies discussed and continue to comply with medication management 3. Referral(s): [place] (In Clinic)  Christiana D Kensly Bowmer   Confirmed patient's address: Yes  Confirmed patient's phone number: Yes  Any changes to demographics: No   Confirmed patient's insurance: Yes  Any changes to patient's insurance: No    The following statements were read to the patient and/or legal guardian that are established with the [facility] Provider.  "The purpose of this phone visit is to provide behavioral health care while limiting exposure to the coronavirus (COVID19).  There is a possibility of technology failure and discussed alternative modes of communication if that failure occurs."

## 2020-08-11 ENCOUNTER — Ambulatory Visit: Payer: Self-pay | Attending: Family Medicine | Admitting: Licensed Clinical Social Worker

## 2020-08-11 ENCOUNTER — Other Ambulatory Visit: Payer: Self-pay

## 2020-08-11 DIAGNOSIS — F411 Generalized anxiety disorder: Secondary | ICD-10-CM

## 2020-08-11 DIAGNOSIS — F331 Major depressive disorder, recurrent, moderate: Secondary | ICD-10-CM

## 2020-08-12 NOTE — BH Specialist Note (Signed)
Integrated Behavioral Health Initial Visit  MRN: [id number] Name: Dylan Hurst  Number of [place] Clinician visits:: 1/6 Session Start time: 3:00 PM session End time: 3:45 PM Total time: 45   Type of Service: [place] Interpretor:No. Interpretor Name and Language: NA   SUBJECTIVE: Dylan Hurst is a 34 y.o. male accompanied by Self Patient was referred by Weyman Pedro for anxiety and depression. Patient reports the following symptoms/concerns: She reports difficulty managing the depression and anxiety symptoms by psychosocial stressors Duration of problem: Years; Severity of problem: moderate  OBJECTIVE: Mood: Anxious and Affect: Appropriate Risk of harm to self or others: No plan to harm self or others  LIFE CONTEXT: Family and Social: Patient resides with mother who he receives strong support from School/Work: Patient is currently unemployed and uninsured Self-Care: Patient enjoys playing soccer. Life Changes: Patient has difficulty managing depression anxiety symptoms  GOALS ADDRESSED: Patient will: 1. Reduce symptoms of: anxiety, depression and stress 2. Increase knowledge and/or ability of: coping skills and healthy habits  3. Demonstrate ability to: Increase healthy adjustment to current life circumstances and Increase adequate support systems for patient/family  INTERVENTIONS: Interventions utilized: Solution-Focused Strategies, Supportive Counseling and Psychoeducation and/or Health Education  Standardized Assessments completed: GAD-7 and PHQ 2&9 with C-SSRS  ASSESSMENT: Patient currently experiencing anxiety and depression symptoms triggered by psychosocial stressors.  Patient scored positive on PHQ-9 however denies current suicidal and/or homicidal ideations.  Protective factors identified and crisis intervention resources discussed.   Patient may benefit from continued therapy. Therapeutic goals established  and healthy coping skills identified.  PLAN: 1. Follow up with behavioral health clinician on : 08/25/2020 2. Behavioral recommendations: Utilize strategies discussed  3. Referral(s): [place] (In Clinic) 4. "From scale of 1-10, how likely are you to follow plan?":   Rebekah [place], LCSW 08/12/2020 11:02 AM

## 2020-08-24 ENCOUNTER — Ambulatory Visit: Payer: Self-pay | Admitting: Pharmacist

## 2020-08-24 ENCOUNTER — Other Ambulatory Visit: Payer: Self-pay

## 2020-08-25 ENCOUNTER — Ambulatory Visit: Payer: Self-pay | Admitting: Licensed Clinical Social Worker

## 2021-01-31 ENCOUNTER — Ambulatory Visit: Payer: Self-pay | Admitting: Infectious Diseases

## 2021-03-14 ENCOUNTER — Ambulatory Visit (INDEPENDENT_AMBULATORY_CARE_PROVIDER_SITE_OTHER): Payer: Self-pay | Admitting: Infectious Diseases

## 2021-03-14 ENCOUNTER — Other Ambulatory Visit: Payer: Self-pay

## 2021-03-14 ENCOUNTER — Encounter: Payer: Self-pay | Admitting: Infectious Diseases

## 2021-03-14 VITALS — BP 132/82 | HR 95 | Temp 98.3°F | Wt 151.0 lb

## 2021-03-14 DIAGNOSIS — Z7689 Persons encountering health services in other specified circumstances: Secondary | ICD-10-CM

## 2021-03-14 DIAGNOSIS — Z1159 Encounter for screening for other viral diseases: Secondary | ICD-10-CM

## 2021-03-14 DIAGNOSIS — Z113 Encounter for screening for infections with a predominantly sexual mode of transmission: Secondary | ICD-10-CM | POA: Insufficient documentation

## 2021-03-14 DIAGNOSIS — Z79899 Other long term (current) drug therapy: Secondary | ICD-10-CM

## 2021-03-14 NOTE — Progress Notes (Signed)
Date:  03/14/2021   HPI: Dylan Hurst is a 35 y.o. male who [facility] for HIV PrEP follow-up. He was previously on descovy but has been off descovy since January since he was not able to come for his appts.  Sexually active with male partners. Last sexual encounter was mid February. Does only receptive and oral sex.  Willing to be re-started on Descovy again.   Marijuana use+. Denies smoking, alcohol or illicit drugs  Work in [employer]  Originally from [place], moved to [place] 1999, lives with mother.   He is interested to establish care with PCP and also willing to see Mental Health counselor.   Patient's Medications  New Prescriptions   No medications on file  Previous Medications   EMTRICITABINE-TENOFOVIR AF (DESCOVY) 200-25 MG TABLET    Take 1 tablet by mouth daily.   SERTRALINE (ZOLOFT) 50 MG TABLET    Take 1 tablet (50 mg total) by mouth daily.  Modified Medications   No medications on file  Discontinued Medications   No medications on file    Allergies: No Known Allergies  Past Medical History: Past Medical History:  Diagnosis Date  . Known health problems: none     Social History: Social History   Socioeconomic History  . Marital status: Single    Spouse name: Not on file  . Number of children: Not on file  . Years of education: Not on file  . Highest education level: Not on file  Occupational History  . Not on file  Tobacco Use  . Smoking status: Never Smoker  . Smokeless tobacco: Never Used  Vaping Use  . Vaping Use: Never used  Substance and Sexual Activity  . Alcohol use: Not Currently  . Drug use: Yes    Types: Marijuana  . Sexual activity: Not on file  Other Topics Concern  . Not on file  Social History Narrative  . Not on file   Social Determinants of Health   Financial Resource Strain: Not on file  Food Insecurity: Not on file  Transportation Needs: Not on file  Physical Activity: Not on file  Stress: Not  on file  Social Connections: Not on file    CHL HIV PREP FLOWSHEET RESULTS 06/10/2019 03/17/2019  Insurance Status Uninsured Uninsured  How did you hear? - Friends  Gender at birth Male Male  Gender identity cis-Male cis-Male  Risk for HIV - >5 partners in past 6 mos (regardless of condom use);Hx of STI;Condomless vaginal or anal intercourse  Sex Partners Men only Men only  # sex partners past 3-6 mos 1-3 >12  Sex activity preferences Insertive and receptive;Oral Receptive;Oral  Condom use No Yes  % condom use - 50  Treated for STI? No Yes  HIV symptoms? N/A N/A  PrEP Eligibility Substantial risk for HIV Substantial risk for HIV    Labs:  SCr: Lab Results  Component Value Date   CREATININE 0.84 05/26/2020   CREATININE 0.92 11/10/2019   CREATININE 0.97 03/17/2019   HIV Lab Results  Component Value Date   HIV NON-REACTIVE 05/26/2020   HIV NON-REACTIVE 02/29/2020   HIV NON-REACTIVE 11/10/2019   HIV NON-REACTIVE 06/10/2019   HIV NON-REACTIVE 03/17/2019   Hepatitis B Lab Results  Component Value Date   HEPBSAB REACTIVE (A) 03/17/2019   HEPBSAG NON-REACTIVE 03/17/2019   Hepatitis C No results found for: HEPCAB, HCVRNAPCRQN Hepatitis A No results found for: HAV RPR and STI Lab Results  Component Value Date  LABRPR NON-REACTIVE 05/26/2020   LABRPR NON-REACTIVE 11/10/2019   LABRPR NON-REACTIVE 03/17/2019    STI Results GC CT  05/26/2020 Negative Negative  05/26/2020 Negative Negative  05/26/2020 Negative Negative  11/10/2019 Negative Negative  11/10/2019 Negative Negative  03/17/2019 Negative Negative  03/17/2019 Negative Negative  03/17/2019 Negative Negative    Assessment: Homosexual Male interested PrEP  Will do following labs today Orders Placed This Encounter  Procedures  . Basic metabolic panel  . HIV Antibody (routine testing w rflx)  . Hepatitis C antibody  . Ambulatory referral to Internal Medicine   If HIV ab is negative, will send in [place] for 3  months Fu in 3 months   STD screening  As above  Hepatitis C screening As above   I spent approx  3 0 minute reviewing data/chart, and coordinating care and >50% direct face to face time providing counseling/discussing diagnostics/treatment plan with patient   Electronically signed by:  Rosiland Oz, MD Infectious Disease Physician [facility] [address] [place], [place] [address] Phone: [phone]  Fax: [phone]

## 2021-03-15 ENCOUNTER — Other Ambulatory Visit: Payer: Self-pay | Admitting: Infectious Diseases

## 2021-03-15 ENCOUNTER — Telehealth: Payer: Self-pay

## 2021-03-15 DIAGNOSIS — Z7252 High risk homosexual behavior: Secondary | ICD-10-CM

## 2021-03-15 LAB — URINE CYTOLOGY ANCILLARY ONLY
Chlamydia: NEGATIVE
Comment: NEGATIVE
Comment: NORMAL
Neisseria Gonorrhea: NEGATIVE

## 2021-03-15 LAB — BASIC METABOLIC PANEL
BUN: 18 mg/dL (ref 7–25)
CO2: 29 mmol/L (ref 20–32)
Calcium: 9.5 mg/dL (ref 8.6–10.3)
Chloride: 104 mmol/L (ref 98–110)
Creat: 0.93 mg/dL (ref 0.60–1.35)
Glucose, Bld: 99 mg/dL (ref 65–99)
Potassium: 4.5 mmol/L (ref 3.5–5.3)
Sodium: 139 mmol/L (ref 135–146)

## 2021-03-15 LAB — CYTOLOGY, (ORAL, ANAL, URETHRAL) ANCILLARY ONLY
Chlamydia: NEGATIVE
Chlamydia: NEGATIVE
Comment: NEGATIVE
Comment: NEGATIVE
Comment: NORMAL
Comment: NORMAL
Neisseria Gonorrhea: NEGATIVE
Neisseria Gonorrhea: NEGATIVE

## 2021-03-15 LAB — HEPATITIS C ANTIBODY
Hepatitis C Ab: NONREACTIVE
SIGNAL TO CUT-OFF: 0.01 (ref <1.00)

## 2021-03-15 LAB — HIV ANTIBODY (ROUTINE TESTING W REFLEX): HIV 1&2 Ab, 4th Generation: NONREACTIVE

## 2021-03-15 MED ORDER — DESCOVY 200-25 MG PO TABS: 1.0000 | ORAL_TABLET | Freq: Every day | ORAL | 2 refills | Status: DC

## 2021-03-15 NOTE — Telephone Encounter (Signed)
RN spoke with patient to inform him that his HIV test was negative and that Dr. West Bali sent in a refill of his Descovy to [facility]. Patient verbalized understanding and has no further questions.   Beryle Flock, RN

## 2021-03-15 NOTE — Telephone Encounter (Signed)
-----   Message from Rosiland Oz, MD sent at 03/15/2021 10:20 AM EDT ----- Patient tested negative for HIV. Refill of descovy sent to [place]

## 2021-03-21 ENCOUNTER — Other Ambulatory Visit (HOSPITAL_COMMUNITY): Payer: Self-pay

## 2021-04-11 ENCOUNTER — Other Ambulatory Visit (HOSPITAL_COMMUNITY): Payer: Self-pay

## 2021-04-11 MED FILL — Emtricitabine-Tenofovir Alafenamide Fumarate Tab 200-25 MG: ORAL | 30 days supply | Qty: 30 | Fill #0 | Status: CN

## 2021-04-14 ENCOUNTER — Other Ambulatory Visit (HOSPITAL_COMMUNITY): Payer: Self-pay

## 2021-05-01 ENCOUNTER — Encounter (HOSPITAL_COMMUNITY): Payer: Self-pay | Admitting: Emergency Medicine

## 2021-05-01 ENCOUNTER — Encounter: Payer: Self-pay | Admitting: Internal Medicine

## 2021-05-01 ENCOUNTER — Emergency Department (HOSPITAL_COMMUNITY)
Admission: EM | Admit: 2021-05-01 | Discharge: 2021-05-01 | Disposition: A | Discharge status: Home / Self Care | Payer: Self-pay | Attending: Emergency Medicine | Admitting: Emergency Medicine

## 2021-05-01 ENCOUNTER — Other Ambulatory Visit: Payer: Self-pay

## 2021-05-01 DIAGNOSIS — F419 Anxiety disorder, unspecified: Secondary | ICD-10-CM | POA: Insufficient documentation

## 2021-05-01 DIAGNOSIS — F1910 Other psychoactive substance abuse, uncomplicated: Secondary | ICD-10-CM | POA: Insufficient documentation

## 2021-05-01 DIAGNOSIS — F17210 Nicotine dependence, cigarettes, uncomplicated: Secondary | ICD-10-CM | POA: Insufficient documentation

## 2021-05-01 HISTORY — DX: Anxiety disorder, unspecified: F41.9

## 2021-05-01 HISTORY — DX: Depression, unspecified: F32.A

## 2021-05-01 LAB — COMPREHENSIVE METABOLIC PANEL
ALT: 23 U/L (ref 0–44)
AST: 26 U/L (ref 15–41)
Albumin: 4.5 g/dL (ref 3.5–5.0)
Alkaline Phosphatase: 91 U/L (ref 38–126)
Anion gap: 9 (ref 5–15)
BUN: 22 mg/dL — ABNORMAL HIGH (ref 6–20)
CO2: 23 mmol/L (ref 22–32)
Calcium: 9.6 mg/dL (ref 8.9–10.3)
Chloride: 102 mmol/L (ref 98–111)
Creatinine, Ser: 1.12 mg/dL (ref 0.61–1.24)
GFR, Estimated: >60 mL/min (ref >60)
Glucose, Bld: 112 mg/dL — ABNORMAL HIGH (ref 70–99)
Potassium: 4.4 mmol/L (ref 3.5–5.1)
Sodium: 134 mmol/L — ABNORMAL LOW (ref 135–145)
Total Bilirubin: 0.6 mg/dL (ref 0.3–1.2)
Total Protein: 7.5 g/dL (ref 6.5–8.1)

## 2021-05-01 LAB — CBC
HCT: 48.5 % (ref 39.0–52.0)
Hemoglobin: 15.6 g/dL (ref 13.0–17.0)
MCH: 27.4 pg (ref 26.0–34.0)
MCHC: 32.2 g/dL (ref 30.0–36.0)
MCV: 85.1 fL (ref 80.0–100.0)
Platelets: 309 10*3/uL (ref 150–400)
RBC: 5.7 MIL/uL (ref 4.22–5.81)
RDW: 13.2 % (ref 11.5–15.5)
WBC: 13.4 10*3/uL — ABNORMAL HIGH (ref 4.0–10.5)
nRBC: 0 % (ref 0.0–0.2)

## 2021-05-01 LAB — RAPID URINE DRUG SCREEN, HOSP PERFORMED
Amphetamines: POSITIVE — AB
Barbiturates: NOT DETECTED
Benzodiazepines: NOT DETECTED
Cocaine: NOT DETECTED
Opiates: NOT DETECTED
Tetrahydrocannabinol: POSITIVE — AB

## 2021-05-01 MED ORDER — LORAZEPAM 1 MG PO TABS
1.0000 mg | ORAL_TABLET | Freq: Once | ORAL | Status: AC
  Administered 2021-05-01: 1 mg via ORAL
  Filled 2021-05-01: qty 1

## 2021-05-01 NOTE — Progress Notes (Deleted)
   CC: ***  HPI:  [name] is a 35 y.o.   Past Medical History:  Diagnosis Date  . Anxiety   . Depression   . Known health problems: none    Review of Systems:  ***  Physical Exam:  There were no vitals filed for this visit. ***  Assessment & Plan:   See Encounters Tab for problem based charting.  Patient {GC/GE:3044014::"discussed with","seen with"} Dr. {NAMES:[id number]::"Dylan Hurst","Dylan Hurst","Dylan Hurst","Dylan Hurst","Dylan Hurst","Dylan Hurst","Dylan Hurst"}

## 2021-05-01 NOTE — ED Notes (Signed)
Pts visibly anxious. Said he did some drugs can't tell rn what kind of drugs he took. Pt anxious that he is going to die. RN informed pt that he will be taken care of and that we are going to look at his labs and urine and make sure everything looks fine.

## 2021-05-01 NOTE — ED Notes (Signed)
Patient verbalized understanding of discharge instructions. Opportunity for questions and answers.  

## 2021-05-01 NOTE — ED Triage Notes (Signed)
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

## 2021-05-02 NOTE — ED Provider Notes (Signed)
[facility] EMERGENCY DEPARTMENT Provider Note   CSN: [id number] Arrival date & time: 05/01/21  6010     History Chief Complaint  Patient presents with  . Drug Problem    Dylan Hurst is a 35 y.o. male.  The patient is here because he is worried he will die in his sleep from doing drugs yesterday.He has no complaints of fever, shortness of breath, chest pain, lightheadedness, syncope or other symptoms.  Patient states that he often does drugs but yesterday was the first time he did IV drugs and is worried.  He is not sure what can of IV drugs he did.   Drug Problem       Past Medical History:  Diagnosis Date  . Anxiety   . Depression   . Known health problems: none     Patient Active Problem List   Diagnosis Date Noted  . On pre-exposure prophylaxis for HIV 03/14/2021  . Screening for STDs (sexually transmitted diseases) 03/14/2021  . Need for hepatitis C screening test 03/14/2021    Past Surgical History:  Procedure Laterality Date  . no past surgery         Family History  Problem Relation Age of Onset  . Thyroid disease Mother   . Diabetes Father     Social History   Tobacco Use  . Smoking status: Light Tobacco Smoker    Types: Cigarettes  . Smokeless tobacco: Never Used  Vaping Use  . Vaping Use: Never used  Substance Use Topics  . Alcohol use: Not Currently  . Drug use: Yes    Types: Marijuana, IV    Home Medications Prior to Admission medications   Medication Sig Start Date End Date Taking? Authorizing Provider  emtricitabine-tenofovir AF (DESCOVY) 200-25 MG tablet TAKE 1 TABLET BY MOUTH DAILY. 03/15/21 03/15/22  Rosiland Oz, MD  sertraline (ZOLOFT) 50 MG tablet TAKE 1 TABLET BY MOUTH DAILY 05/26/20 05/26/21  Argentina Donovan, PA-C    Allergies    Patient has no known allergies.  Review of Systems   Review of Systems  All other systems reviewed and are negative.   Physical Exam Updated Vital Signs BP (!)  129/98 (BP Location: Right Arm)   Pulse (!) 114   Temp 98.1 F (36.7 C) (Oral)   Resp 19   SpO2 99%   Physical Exam Vitals and nursing note reviewed.  Constitutional:      Appearance: He is well-developed.  HENT:     Head: Normocephalic and atraumatic.     Nose: No congestion or rhinorrhea.     Mouth/Throat:     Mouth: Mucous membranes are moist.  Cardiovascular:     Rate and Rhythm: Normal rate.  Pulmonary:     Effort: Pulmonary effort is normal. No respiratory distress.  Abdominal:     General: There is no distension.  Musculoskeletal:        General: Normal range of motion.     Cervical back: Normal range of motion.  Skin:    General: Skin is warm and dry.     Coloration: Skin is not jaundiced or pale.  Neurological:     General: No focal deficit present.     Mental Status: He is alert.  Psychiatric:        Mood and Affect: Mood is anxious.     ED Results / Procedures / Treatments   Labs (all labs ordered are listed, but only abnormal results are displayed) Labs Reviewed  COMPREHENSIVE  METABOLIC PANEL - Abnormal; Notable for the following components:      Result Value   Sodium 134 (*)    Glucose, Bld 112 (*)    BUN 22 (*)    All other components within normal limits  CBC - Abnormal; Notable for the following components:   WBC 13.4 (*)    All other components within normal limits  RAPID URINE DRUG SCREEN, HOSP PERFORMED - Abnormal; Notable for the following components:   Amphetamines POSITIVE (*)    Tetrahydrocannabinol POSITIVE (*)    All other components within normal limits    EKG None  Radiology No results found.  Procedures Procedures   Medications Ordered in ED Medications  LORazepam (ATIVAN) tablet 1 mg (1 mg Oral Given 05/01/21 0421)    ED Course  I have reviewed the triage vital signs and the nursing notes.  Pertinent labs & imaging results that were available during my care of the patient were reviewed by me and considered in my  medical decision making (see chart for details).    MDM Rules/Calculators/A&P                          Anxious. Likely high right now but no evidence of imminent death. VS reassuring. Stable for discharge.   Final Clinical Impression(s) / ED Diagnoses Final diagnoses:  Substance abuse [facility])  Anxiety    Rx / DC Orders ED Discharge Orders    None       Almena Hokenson, Corene Cornea, MD 05/02/21 0147

## 2021-05-15 ENCOUNTER — Other Ambulatory Visit (HOSPITAL_COMMUNITY): Payer: Self-pay

## 2021-06-14 ENCOUNTER — Ambulatory Visit: Payer: Self-pay | Admitting: Pharmacist

## 2021-06-21 ENCOUNTER — Other Ambulatory Visit (HOSPITAL_COMMUNITY)
Admission: RE | Admit: 2021-06-21 | Discharge: 2021-06-21 | Disposition: A | Discharge status: Home / Self Care | Payer: Self-pay | Source: Ambulatory Visit | Attending: Infectious Diseases | Admitting: Infectious Diseases

## 2021-06-21 ENCOUNTER — Other Ambulatory Visit: Payer: Self-pay

## 2021-06-21 ENCOUNTER — Other Ambulatory Visit (HOSPITAL_COMMUNITY): Payer: Self-pay

## 2021-06-21 ENCOUNTER — Ambulatory Visit (INDEPENDENT_AMBULATORY_CARE_PROVIDER_SITE_OTHER): Payer: Self-pay | Admitting: Pharmacist

## 2021-06-21 DIAGNOSIS — Z113 Encounter for screening for infections with a predominantly sexual mode of transmission: Secondary | ICD-10-CM | POA: Insufficient documentation

## 2021-06-21 DIAGNOSIS — Z79899 Other long term (current) drug therapy: Secondary | ICD-10-CM

## 2021-06-21 DIAGNOSIS — Z7252 High risk homosexual behavior: Secondary | ICD-10-CM

## 2021-06-21 NOTE — Progress Notes (Signed)
Date:  06/21/2021   HPI: [name] is a 35 y.o. male who presents to the [facility] clinic for HIV PrEP follow-up.  Insured   []    Uninsured  [x]    Patient Active Problem List   Diagnosis Date Noted   On pre-exposure prophylaxis for HIV 03/14/2021   Screening for STDs (sexually transmitted diseases) 03/14/2021   Need for hepatitis C screening test 03/14/2021    Patient's Medications  New Prescriptions   No medications on file  Previous Medications   EMTRICITABINE-TENOFOVIR AF (DESCOVY) 200-25 MG TABLET    TAKE 1 TABLET BY MOUTH DAILY.   SERTRALINE (ZOLOFT) 50 MG TABLET    TAKE 1 TABLET BY MOUTH DAILY  Modified Medications   No medications on file  Discontinued Medications   No medications on file    Allergies: No Known Allergies  Past Medical History: Past Medical History:  Diagnosis Date   Anxiety    Depression    Known health problems: none     Social History: Social History   Socioeconomic History   Marital status: Single    Spouse name: Not on file   Number of children: Not on file   Years of education: Not on file   Highest education level: Not on file  Occupational History   Not on file  Tobacco Use   Smoking status: Light Smoker    Pack years: 0.00    Types: Cigarettes   Smokeless tobacco: Never  Vaping Use   Vaping Use: Never used  Substance and Sexual Activity   Alcohol use: Not Currently   Drug use: Yes    Types: Marijuana, IV   Sexual activity: Not on file  Other Topics Concern   Not on file  Social History Narrative   Not on file   Social Determinants of Health   Financial Resource Strain: Not on file  Food Insecurity: Not on file  Transportation Needs: Not on file  Physical Activity: Not on file  Stress: Not on file  Social Connections: Not on file    CHL HIV PREP FLOWSHEET RESULTS 06/10/2019 03/17/2019  Insurance Status Uninsured Uninsured  How did you hear? - Friends  Gender at birth Male Male  Gender identity  cis-Male cis-Male  Risk for HIV - >5 partners in past 6 mos (regardless of condom use);Hx of STI;Condomless vaginal or anal intercourse  Sex Partners Men only Men only  # sex partners past 3-6 mos 1-3 >12  Sex activity preferences Insertive and receptive;Oral Receptive;Oral  Condom use No Yes  % condom use - 50  Treated for STI? No Yes  HIV symptoms? N/A N/A  PrEP Eligibility Substantial risk for HIV Substantial risk for HIV    Labs:  SCr: Lab Results  Component Value Date   CREATININE 1.12 05/01/2021   CREATININE 0.93 03/14/2021   CREATININE 0.84 05/26/2020   CREATININE 0.92 11/10/2019   CREATININE 0.97 03/17/2019   HIV Lab Results  Component Value Date   HIV NON-REACTIVE 03/14/2021   HIV NON-REACTIVE 05/26/2020   HIV NON-REACTIVE 02/29/2020   HIV NON-REACTIVE 11/10/2019   HIV NON-REACTIVE 06/10/2019   Hepatitis B Lab Results  Component Value Date   HEPBSAB REACTIVE (A) 03/17/2019   HEPBSAG NON-REACTIVE 03/17/2019   Hepatitis C Lab Results  Component Value Date   HEPCAB NON-REACTIVE 03/14/2021   Hepatitis A No results found for: HAV RPR and STI Lab Results  Component Value Date   LABRPR NON-REACTIVE 05/26/2020   LABRPR NON-REACTIVE 11/10/2019  LABRPR NON-REACTIVE 03/17/2019    STI Results GC CT  03/14/2021 Negative Negative  03/14/2021 Negative Negative  03/14/2021 Negative Negative  05/26/2020 Negative Negative  05/26/2020 Negative Negative  05/26/2020 Negative Negative  11/10/2019 Negative Negative  11/10/2019 Negative Negative  03/17/2019 Negative Negative  03/17/2019 Negative Negative  03/17/2019 Negative Negative    Assessment: Dylan Hurst is here today to follow up for HIV PrEP. Today was more of a discussion about his mental health. He was recently seen in the ED after he had impending death thoughts. He states that he took drugs intravenously and became scared that he was going to die. He is not 100% sure of what he injected but thinks it was meth. The  day after her used IV drugs, he had "the most perfect day" with his mom and nephew. He struggles with severe anxiety.  He was seeing J[facility] at [facility] but has not followed up with her since last August. He said that he tried to schedule an appointment with her but the front desk told him that they couldn't find his records (?). I encouraged him to try and reschedule with her. I will also send her an epic message to see if she would be willing to reach out to him.   He has been sexually active with 5 people since last visit. No condom use at all. Encouraged condom use for protection. He has not taken PrEP since before March. His patient assistance has expired and he has been told a few times to bring in paystubs to submit to [facility] for assistance. He did not bring paystubs with him, but he says he will bring them in tomorrow. He is primarily having rectal intercourse.   Will screen for HIV and STIs today and hopefully get him back on Descovy if he will bring in the necessary documentation.   Plan: - HIV antibody and urine/oral/rectal gonorrhea/chlamydia cytology today - Reach out to [facility] to start seeing her again for your mental health needs - Bring in paystubs for Descovy assistance - Descovy x 3 months if HIV negative - F/u in 3 months  Yaviel Kloster L. Ramonica Grigg, PharmD, BCIDP, [place], CPP Clinical Pharmacist Practitioner [place] for Infectious Disease 06/21/2021, 2:54 PM

## 2021-06-22 ENCOUNTER — Other Ambulatory Visit (HOSPITAL_COMMUNITY): Payer: Self-pay

## 2021-06-22 LAB — CYTOLOGY, (ORAL, ANAL, URETHRAL) ANCILLARY ONLY
Chlamydia: NEGATIVE
Chlamydia: NEGATIVE
Comment: NEGATIVE
Comment: NORMAL
Comment: NEGATIVE
Comment: NORMAL
Neisseria Gonorrhea: NEGATIVE
Neisseria Gonorrhea: NEGATIVE

## 2021-06-22 LAB — URINE CYTOLOGY ANCILLARY ONLY
Chlamydia: NEGATIVE
Comment: NEGATIVE
Comment: NORMAL
Neisseria Gonorrhea: NEGATIVE

## 2021-06-22 LAB — HIV ANTIBODY (ROUTINE TESTING W REFLEX): HIV 1&2 Ab, 4th Generation: NONREACTIVE

## 2021-06-22 MED ORDER — EMTRICITABINE-TENOFOVIR AF 200-25 MG PO TABS
1.0000 | ORAL_TABLET | Freq: Every day | ORAL | 2 refills | Status: DC
  Filled 2021-06-22 – 2021-07-31 (×2): qty 30, 30d supply, fill #0
  Filled 2021-08-24: qty 30, 30d supply, fill #1
  Filled 2021-10-03: qty 30, 30d supply, fill #2

## 2021-07-06 ENCOUNTER — Institutional Professional Consult (permissible substitution) (INDEPENDENT_AMBULATORY_CARE_PROVIDER_SITE_OTHER): Payer: Self-pay | Admitting: Clinical

## 2021-07-07 ENCOUNTER — Telehealth: Payer: Self-pay | Admitting: Clinical

## 2021-07-07 NOTE — Telephone Encounter (Signed)
Close  

## 2021-07-17 ENCOUNTER — Telehealth: Payer: Self-pay

## 2021-07-17 NOTE — Telephone Encounter (Signed)
[facility] Patient Advocate Encounter  [facility] and I have been unsuccsessful in reaching patient to be able to refill medication.    I have also reached out to get the patients income verification so I can apply for Advancing Assistance.  We have tried multiple times without a response.  Ileene Patrick, [facility] Patient [facility] for Infectious Disease Phone: [phone] Fax:  [phone]

## 2021-07-20 ENCOUNTER — Encounter (INDEPENDENT_AMBULATORY_CARE_PROVIDER_SITE_OTHER): Payer: Self-pay

## 2021-07-20 ENCOUNTER — Ambulatory Visit (INDEPENDENT_AMBULATORY_CARE_PROVIDER_SITE_OTHER): Payer: Self-pay | Admitting: Clinical

## 2021-07-20 ENCOUNTER — Other Ambulatory Visit: Payer: Self-pay

## 2021-07-20 DIAGNOSIS — F411 Generalized anxiety disorder: Secondary | ICD-10-CM

## 2021-07-20 DIAGNOSIS — F39 Unspecified mood [affective] disorder: Secondary | ICD-10-CM

## 2021-07-20 DIAGNOSIS — Z87898 Personal history of other specified conditions: Secondary | ICD-10-CM
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
## 2021-07-25 DIAGNOSIS — F411 Generalized anxiety disorder: Secondary | ICD-10-CM | POA: Insufficient documentation

## 2021-07-25 DIAGNOSIS — Z87898 Personal history of other specified conditions: Secondary | ICD-10-CM | POA: Insufficient documentation

## 2021-07-28 ENCOUNTER — Other Ambulatory Visit: Payer: Self-pay

## 2021-07-28 ENCOUNTER — Ambulatory Visit (INDEPENDENT_AMBULATORY_CARE_PROVIDER_SITE_OTHER): Payer: Self-pay | Admitting: Pharmacist

## 2021-07-28 DIAGNOSIS — Z113 Encounter for screening for infections with a predominantly sexual mode of transmission: Secondary | ICD-10-CM

## 2021-07-28 NOTE — Progress Notes (Signed)
Date:  07/28/2021   HPI: Dylan Hurst is a 35 y.o. male who presents to the [facility] for HIV PrEP follow-up.  Insured   '[]'$    Uninsured  '[x]'$    Patient Active Problem List   Diagnosis Date Noted   Generalized anxiety disorder 07/25/2021   History of substance use 07/25/2021   On pre-exposure prophylaxis for HIV 03/14/2021   Screening for STDs (sexually transmitted diseases) 03/14/2021   Need for hepatitis C screening test 03/14/2021    Patient's Medications  New Prescriptions   No medications on file  Previous Medications   EMTRICITABINE-TENOFOVIR AF (DESCOVY) 200-25 MG TABLET    Take 1 tablet by mouth daily.   SERTRALINE (ZOLOFT) 50 MG TABLET    TAKE 1 TABLET BY MOUTH DAILY  Modified Medications   No medications on file  Discontinued Medications   No medications on file    Allergies: No Known Allergies  Past Medical History: Past Medical History:  Diagnosis Date   Anxiety    Depression    Known health problems: none     Social History: Social History   Socioeconomic History   Marital status: Single    Spouse name: Not on file   Number of children: Not on file   Years of education: Not on file   Highest education level: Not on file  Occupational History   Not on file  Tobacco Use   Smoking status: Light Smoker    Types: Cigarettes   Smokeless tobacco: Never  Vaping Use   Vaping Use: Never used  Substance and Sexual Activity   Alcohol use: Not Currently   Drug use: Yes    Types: Marijuana, IV   Sexual activity: Not on file  Other Topics Concern   Not on file  Social History Narrative   Not on file   Social Determinants of Health   Financial Resource Strain: Not on file  Food Insecurity: Not on file  Transportation Needs: Not on file  Physical Activity: Not on file  Stress: Not on file  Social Connections: Not on file    CHL HIV PREP FLOWSHEET RESULTS 06/10/2019 03/17/2019  Insurance Status Uninsured Uninsured  How did you hear? -  Friends  Gender at birth Male Male  Gender identity cis-Male cis-Male  Risk for HIV - >5 partners in past 6 mos (regardless of condom use);Hx of STI;Condomless vaginal or anal intercourse  Sex Partners Men only Men only  # sex partners past 3-6 mos 1-3 >12  Sex activity preferences Insertive and receptive;Oral Receptive;Oral  Condom use No Yes  % condom use - 50  Treated for STI? No Yes  HIV symptoms? N/A N/A  PrEP Eligibility Substantial risk for HIV Substantial risk for HIV    Labs:  SCr: Lab Results  Component Value Date   CREATININE 1.12 05/01/2021   CREATININE 0.93 03/14/2021   CREATININE 0.84 05/26/2020   CREATININE 0.92 11/10/2019   CREATININE 0.97 03/17/2019   HIV Lab Results  Component Value Date   HIV NON-REACTIVE 06/21/2021   HIV NON-REACTIVE 03/14/2021   HIV NON-REACTIVE 05/26/2020   HIV NON-REACTIVE 02/29/2020   HIV NON-REACTIVE 11/10/2019   Hepatitis B Lab Results  Component Value Date   HEPBSAB REACTIVE (A) 03/17/2019   HEPBSAG NON-REACTIVE 03/17/2019   Hepatitis C Lab Results  Component Value Date   HEPCAB NON-REACTIVE 03/14/2021   Hepatitis A No results found for: HAV RPR and STI Lab Results  Component Value Date   LABRPR NON-REACTIVE  05/26/2020   LABRPR NON-REACTIVE 11/10/2019   LABRPR NON-REACTIVE 03/17/2019    STI Results GC CT  06/21/2021 Negative Negative  06/21/2021 Negative Negative  06/21/2021 Negative Negative  03/14/2021 Negative Negative  03/14/2021 Negative Negative  03/14/2021 Negative Negative  05/26/2020 Negative Negative  05/26/2020 Negative Negative  05/26/2020 Negative Negative  11/10/2019 Negative Negative  11/10/2019 Negative Negative  03/17/2019 Negative Negative  03/17/2019 Negative Negative  03/17/2019 Negative Negative    Assessment: Dylan Hurst is here today for STI testing. I saw him last month for PrEP follow up and screened him for all STIs. He has not been sexually active since that appointment but has severe  anxiety about acquiring HIV. He had a few small red bumps on his penis 2 weeks ago, but they have since resolved. He also reports bumps on his rectum that have also went away. They were not painful and had no odor or discharge. I told him to all if they come back and he can be seen by one of our providers.   He has not been sexually active lately but prefers male partners and does not routinely use condoms. He has been off of PrEP for awhile now. He is uninsured and we have asked him many, many times to bring in hs paystubs so we can apply to get him free medication. He has promised to bring them in and then does not. I told him that he must bring them to [place] in order to receive medication, as I've told him before. He is at a very high risk of acquiring HIV since he has unprotected rectal sex. I discussed this with him. He states that he will bring in the paystubs today.   Will rescreen for STIs and hope that he brings in his paperwork.  Plan: - Urine and rectal GC/chlamydia screen, HIV antibody/RNA, and RPR today - Bring in paystubs  Dylan Hurst L. Eber Hong, PharmD, BCIDP, AAHIVP, CPP Clinical Pharmacist Practitioner Infectious Diseases [facility] 07/28/2021, 11:20 AM

## 2021-07-30 LAB — CYTOLOGY, (ORAL, ANAL, URETHRAL) ANCILLARY ONLY
Chlamydia: NEGATIVE
Comment: NEGATIVE
Comment: NORMAL
Neisseria Gonorrhea: NEGATIVE

## 2021-07-30 LAB — URINE CYTOLOGY ANCILLARY ONLY
Chlamydia: NEGATIVE
Comment: NEGATIVE
Comment: NORMAL
Neisseria Gonorrhea: NEGATIVE

## 2021-07-31 ENCOUNTER — Telehealth: Payer: Self-pay

## 2021-07-31 ENCOUNTER — Other Ambulatory Visit (HOSPITAL_COMMUNITY): Payer: Self-pay

## 2021-07-31 NOTE — Telephone Encounter (Signed)
[facility] Patient Advocate Encounter  Completed and sent [employer] Advancing Access application for [place] for this patient who is uninsured.    Patient is approved 07/31/21 through 07/31/22.  BIN      [id number] PCN    [id number] GRP    [id number] ID        [id number]   Ileene Patrick, [place] Patient [facility] Phone: [phone] Fax:  [phone]

## 2021-08-01 ENCOUNTER — Telehealth: Payer: Self-pay | Admitting: Pharmacist

## 2021-08-01 LAB — RPR: RPR Ser Ql: REACTIVE — AB

## 2021-08-01 LAB — HIV ANTIBODY (ROUTINE TESTING W REFLEX): HIV 1&2 Ab, 4th Generation: NONREACTIVE

## 2021-08-01 LAB — FLUORESCENT TREPONEMAL AB(FTA)-IGG-BLD: Fluorescent Treponemal ABS: REACTIVE — AB

## 2021-08-01 LAB — HIV-1 RNA QUANT-NO REFLEX-BLD
HIV 1 RNA Quant: NOT DETECTED Copies/mL
HIV-1 RNA Quant, Log: NOT DETECTED Log cps/mL

## 2021-08-01 LAB — RPR TITER: RPR Titer: 1:64 {titer} — ABNORMAL HIGH

## 2021-08-01 NOTE — Telephone Encounter (Signed)
Patient tested positive for syphilis; RPR is 1:64. Last negative was June 2021. Called to discuss. He has no allergies to antibiotics or penicillin. He will need Bicillin 24 million units IM x 3 weekly doses. He is in [place] right now for work and will be back next Thursday. He will come in on Friday 8/19 and get his first round of Bicillin. He agrees to abstain from any sexual activity until we can get him treated.

## 2021-08-03 ENCOUNTER — Ambulatory Visit (INDEPENDENT_AMBULATORY_CARE_PROVIDER_SITE_OTHER): Payer: Self-pay | Admitting: Clinical

## 2021-08-11 ENCOUNTER — Ambulatory Visit: Payer: Self-pay | Admitting: Pharmacist

## 2021-08-14 ENCOUNTER — Telehealth: Payer: Self-pay

## 2021-08-14 NOTE — Telephone Encounter (Signed)
Thank you :)

## 2021-08-14 NOTE — Telephone Encounter (Signed)
Received call from [place], [place] health department to inquire if patient had treatment on 08/11/21. Patient did not show for that appointment and has not made any attempts to reach out to [facility] to reschedule.  HD will reach out to patient for treatment.  Dylan Hurst

## 2021-08-22 ENCOUNTER — Other Ambulatory Visit: Payer: Self-pay

## 2021-08-22 ENCOUNTER — Ambulatory Visit (INDEPENDENT_AMBULATORY_CARE_PROVIDER_SITE_OTHER): Payer: Self-pay | Admitting: Pharmacist

## 2021-08-22 ENCOUNTER — Other Ambulatory Visit (HOSPITAL_COMMUNITY): Payer: Self-pay

## 2021-08-22 DIAGNOSIS — A539 Syphilis, unspecified: Secondary | ICD-10-CM

## 2021-08-22 MED ORDER — PENICILLIN G BENZATHINE 1200000 UNIT/2ML IM SUSY
2.4000 10*6.[IU] | PREFILLED_SYRINGE | Freq: Once | INTRAMUSCULAR | Status: AC
Start: 2021-08-22 — End: 2021-08-22
  Administered 2021-08-22: 2.4 10*6.[IU] via INTRAMUSCULAR

## 2021-08-22 MED ORDER — DOXYCYCLINE HYCLATE 100 MG PO TABS
100.0000 mg | ORAL_TABLET | Freq: Two times a day (BID) | ORAL | 0 refills | Status: DC
Start: 2021-08-22 — End: 2022-03-08
  Filled 2021-08-22: qty 56, 28d supply, fill #0

## 2021-08-22 NOTE — Progress Notes (Signed)
HPI: Dylan Hurst is a 35 y.o. male who presents to the [facility] for syphilis treatment.  Patient Active Problem List   Diagnosis Date Noted   Generalized anxiety disorder 07/25/2021   History of substance use 07/25/2021   On pre-exposure prophylaxis for HIV 03/14/2021   Screening for STDs (sexually transmitted diseases) 03/14/2021   Need for hepatitis C screening test 03/14/2021    Patient's Medications  New Prescriptions   No medications on file  Previous Medications   EMTRICITABINE-TENOFOVIR AF (DESCOVY) 200-25 MG TABLET    Take 1 tablet by mouth daily.   SERTRALINE (ZOLOFT) 50 MG TABLET    TAKE 1 TABLET BY MOUTH DAILY  Modified Medications   No medications on file  Discontinued Medications   No medications on file    Allergies: No Known Allergies  Past Medical History: Past Medical History:  Diagnosis Date   Anxiety    Depression    Known health problems: none     Social History: Social History   Socioeconomic History   Marital status: Single    Spouse name: Not on file   Number of children: Not on file   Years of education: Not on file   Highest education level: Not on file  Occupational History   Not on file  Tobacco Use   Smoking status: Light Smoker    Types: Cigarettes   Smokeless tobacco: Never  Vaping Use   Vaping Use: Never used  Substance and Sexual Activity   Alcohol use: Not Currently   Drug use: Yes    Types: Marijuana, IV   Sexual activity: Not on file  Other Topics Concern   Not on file  Social History Narrative   Not on file   Social Determinants of Health   Financial Resource Strain: Not on file  Food Insecurity: Not on file  Transportation Needs: Not on file  Physical Activity: Not on file  Stress: Not on file  Social Connections: Not on file    Labs: Lab Results  Component Value Date   HIV1RNAQUANT Not Detected 07/28/2021    RPR and STI Lab Results  Component Value Date   LABRPR REACTIVE (A)  07/28/2021   LABRPR NON-REACTIVE 05/26/2020   LABRPR NON-REACTIVE 11/10/2019   LABRPR NON-REACTIVE 03/17/2019   RPRTITER 1:64 (H) 07/28/2021    STI Results GC CT  07/28/2021 Negative Negative  07/28/2021 Negative Negative  06/21/2021 Negative Negative  06/21/2021 Negative Negative  06/21/2021 Negative Negative  03/14/2021 Negative Negative  03/14/2021 Negative Negative  03/14/2021 Negative Negative  05/26/2020 Negative Negative  05/26/2020 Negative Negative  05/26/2020 Negative Negative  11/10/2019 Negative Negative  11/10/2019 Negative Negative  03/17/2019 Negative Negative  03/17/2019 Negative Negative  03/17/2019 Negative Negative    Hepatitis B Lab Results  Component Value Date   HEPBSAB REACTIVE (A) 03/17/2019   HEPBSAG NON-REACTIVE 03/17/2019   Hepatitis C Lab Results  Component Value Date   HEPCAB NON-REACTIVE 03/14/2021   Hepatitis A No results found for: HAV Lipids: No results found for: CHOL, TRIG, HDL, CHOLHDL, VLDL, LDLCALC  Assessment: [name] presents today for syphilis treatment. Tested positive on 07/28/21 with a titer of 1:64. Last documented RPR was negative on 05/26/20. Patient will need Bicillin 2.4 million units x 3 weekly doses. No known allergies to any antibiotics.   Administered Bicillin LA 1.2 million units in each upper outer quadrant of the left and right gluteal muscle, totaling a dose of 2.4 million units. Patient tolerated well. Advised to abstain  from sexual activity for 10 days and to ask partners to get tested and treated. Advised that the health department should be reaching out to contact trace. Answered all questions. Patient will call with any issues.  After administering the first set of injections, patient let me know that he will be out of town for work with unknown return date - maybe a few weeks or more. Will send in doxycycline x 28 days to finish out his treatment. He should be able to get this for free from our uninsured STI program. Advised him  to take it twice per day with a meal and to not miss any doses. He started Descovy for PrEP last week. He will f/u with me in 1 month. Will administer MPX vaccine at that time as well since patient deferred today.  Plan: - Administered Bicillin LA 2.4 million units for syphilis diagnosis - Finish out treatment with 28 days of doxycycline - MPX vaccine next week - F/u in 1 month - Call with any issues or questions  Kensi Karr L. Guerin Lashomb, PharmD, BCIDP, AAHIVP, [place] Clinical Pharmacist Practitioner [place] [facility]

## 2021-08-24 ENCOUNTER — Other Ambulatory Visit (HOSPITAL_COMMUNITY): Payer: Self-pay

## 2021-09-04 ENCOUNTER — Other Ambulatory Visit (HOSPITAL_COMMUNITY): Payer: Self-pay

## 2021-09-20 ENCOUNTER — Ambulatory Visit: Payer: Self-pay | Admitting: Pharmacist

## 2021-10-03 ENCOUNTER — Other Ambulatory Visit (HOSPITAL_COMMUNITY): Payer: Self-pay

## 2021-10-05 ENCOUNTER — Other Ambulatory Visit (HOSPITAL_COMMUNITY): Payer: Self-pay

## 2021-10-26 ENCOUNTER — Other Ambulatory Visit: Payer: Self-pay

## 2021-10-26 ENCOUNTER — Ambulatory Visit (INDEPENDENT_AMBULATORY_CARE_PROVIDER_SITE_OTHER): Payer: Self-pay | Admitting: Pharmacist

## 2021-10-26 DIAGNOSIS — Z79899 Other long term (current) drug therapy: Secondary | ICD-10-CM

## 2021-10-26 NOTE — Progress Notes (Signed)
Date:  10/26/2021   HPI: Dylan Hurst is a 35 y.o. male who presents to the [facility] for HIV PrEP follow-up.  Insured   []    Uninsured  [x]    Patient Active Problem List   Diagnosis Date Noted   Generalized anxiety disorder 07/25/2021   History of substance use 07/25/2021   On pre-exposure prophylaxis for HIV 03/14/2021   Screening for STDs (sexually transmitted diseases) 03/14/2021   Need for hepatitis C screening test 03/14/2021    Patient's Medications  New Prescriptions   No medications on file  Previous Medications   DOXYCYCLINE (VIBRA-TABS) 100 MG TABLET    Take 1 tablet by mouth 2 times daily.   EMTRICITABINE-TENOFOVIR AF (DESCOVY) 200-25 MG TABLET    Take 1 tablet by mouth daily.   SERTRALINE (ZOLOFT) 50 MG TABLET    TAKE 1 TABLET BY MOUTH DAILY  Modified Medications   No medications on file  Discontinued Medications   No medications on file    Allergies: No Known Allergies  Past Medical History: Past Medical History:  Diagnosis Date   Anxiety    Depression    Known health problems: none     Social History: Social History   Socioeconomic History   Marital status: Single    Spouse name: Not on file   Number of children: Not on file   Years of education: Not on file   Highest education level: Not on file  Occupational History   Not on file  Tobacco Use   Smoking status: Light Smoker    Types: Cigarettes   Smokeless tobacco: Never  Vaping Use   Vaping Use: Never used  Substance and Sexual Activity   Alcohol use: Not Currently   Drug use: Yes    Types: Marijuana, IV   Sexual activity: Not on file  Other Topics Concern   Not on file  Social History Narrative   Not on file   Social Determinants of Health   Financial Resource Strain: Not on file  Food Insecurity: Not on file  Transportation Needs: Not on file  Physical Activity: Not on file  Stress: Not on file  Social Connections: Not on file    CHL HIV PREP FLOWSHEET  RESULTS 06/10/2019 03/17/2019  Insurance Status Uninsured Uninsured  How did you hear? - Friends  Gender at birth Male Male  Gender identity cis-Male cis-Male  Risk for HIV - >5 partners in past 6 mos (regardless of condom use);Hx of STI;Condomless vaginal or anal intercourse  Sex Partners Men only Men only  # sex partners past 3-6 mos 1-3 >12  Sex activity preferences Insertive and receptive;Oral Receptive;Oral  Condom use No Yes  % condom use - 50  Treated for STI? No Yes  HIV symptoms? N/A N/A  PrEP Eligibility Substantial risk for HIV Substantial risk for HIV    Labs:  SCr: Lab Results  Component Value Date   CREATININE 1.12 05/01/2021   CREATININE 0.93 03/14/2021   CREATININE 0.84 05/26/2020   CREATININE 0.92 11/10/2019   CREATININE 0.97 03/17/2019   HIV Lab Results  Component Value Date   HIV NON-REACTIVE 07/28/2021   HIV NON-REACTIVE 06/21/2021   HIV NON-REACTIVE 03/14/2021   HIV NON-REACTIVE 05/26/2020   HIV NON-REACTIVE 02/29/2020   Hepatitis B Lab Results  Component Value Date   HEPBSAB REACTIVE (A) 03/17/2019   HEPBSAG NON-REACTIVE 03/17/2019   Hepatitis C Lab Results  Component Value Date   HEPCAB NON-REACTIVE 03/14/2021   Hepatitis A  No results found for: HAV RPR and STI Lab Results  Component Value Date   LABRPR REACTIVE (A) 07/28/2021   LABRPR NON-REACTIVE 05/26/2020   LABRPR NON-REACTIVE 11/10/2019   LABRPR NON-REACTIVE 03/17/2019   RPRTITER 1:64 (H) 07/28/2021    STI Results GC CT  07/28/2021 Negative Negative  07/28/2021 Negative Negative  06/21/2021 Negative Negative  06/21/2021 Negative Negative  06/21/2021 Negative Negative  03/14/2021 Negative Negative  03/14/2021 Negative Negative  03/14/2021 Negative Negative  05/26/2020 Negative Negative  05/26/2020 Negative Negative  05/26/2020 Negative Negative  11/10/2019 Negative Negative  11/10/2019 Negative Negative  03/17/2019 Negative Negative  03/17/2019 Negative Negative  03/17/2019 Negative  Negative    Assessment: Dylan Hurst presents to clinic today for HIV PrEP follow-up. He takes Descovy without any issues or missed doses. At his last visit he received x1 dose of Bicillin-LA 2.4 Million Units, and after administration noted that he would be out of town and not be able to follow-up for weekly injections. He was prescribed Doxycycline 100 mg PO BID x28 days to complete treatment. He noted that he completed treatment with no issues and reports no doses missed. Syphilis symptoms have now resolved. He is still with the same two partners as last visit, but would like STI testing today. He expressed interest in Apretude for the future. We discussed that it would require sooner follow-up visits and how the appointments would work. He will think on this and re-visit at a later date.   Plan: Check HIV antibody, RPR Urine, Oral, and Rectal Cytology Descovy x 3 months if HIV antibody negative Follow-up in 3 months on 01/29/22 @9 :15 with [place], PharmD PGY2 Infectious Diseases Pharmacy Resident

## 2021-10-27 ENCOUNTER — Telehealth: Payer: Self-pay

## 2021-10-27 ENCOUNTER — Other Ambulatory Visit: Payer: Self-pay

## 2021-10-27 ENCOUNTER — Ambulatory Visit (INDEPENDENT_AMBULATORY_CARE_PROVIDER_SITE_OTHER): Payer: Self-pay | Admitting: Pharmacist

## 2021-10-27 DIAGNOSIS — A64 Unspecified sexually transmitted disease: Secondary | ICD-10-CM

## 2021-10-27 LAB — CYTOLOGY, (ORAL, ANAL, URETHRAL) ANCILLARY ONLY
Chlamydia: NEGATIVE
Chlamydia: NEGATIVE
Comment: NEGATIVE
Comment: NEGATIVE
Comment: NORMAL
Comment: NORMAL
Neisseria Gonorrhea: POSITIVE — AB
Neisseria Gonorrhea: POSITIVE — AB

## 2021-10-27 LAB — URINE CYTOLOGY ANCILLARY ONLY
Chlamydia: NEGATIVE
Comment: NEGATIVE
Comment: NORMAL
Neisseria Gonorrhea: NEGATIVE

## 2021-10-27 MED ORDER — CEFTRIAXONE SODIUM 500 MG IJ SOLR
500.0000 mg | Freq: Once | INTRAMUSCULAR | Status: AC
Start: 2021-10-27 — End: 2021-10-27
  Administered 2021-10-27: 500 mg via INTRAMUSCULAR

## 2021-10-27 NOTE — Telephone Encounter (Signed)
Thanks Dylan Hurst..

## 2021-10-27 NOTE — Progress Notes (Signed)
Date:  10/27/2021   HPI: Dylan Hurst is a 35 y.o. male who presents to the [facility] clinic for STI Treatment.  Insured   []    Uninsured  [x]    Patient Active Problem List   Diagnosis Date Noted   Generalized anxiety disorder 07/25/2021   History of substance use 07/25/2021   On pre-exposure prophylaxis for HIV 03/14/2021   Screening for STDs (sexually transmitted diseases) 03/14/2021   Need for hepatitis C screening test 03/14/2021    Patient's Medications  New Prescriptions   No medications on file  Previous Medications   DOXYCYCLINE (VIBRA-TABS) 100 MG TABLET    Take 1 tablet by mouth 2 times daily.   EMTRICITABINE-TENOFOVIR AF (DESCOVY) 200-25 MG TABLET    Take 1 tablet by mouth daily.   SERTRALINE (ZOLOFT) 50 MG TABLET    TAKE 1 TABLET BY MOUTH DAILY  Modified Medications   No medications on file  Discontinued Medications   No medications on file    Allergies: No Known Allergies  Past Medical History: Past Medical History:  Diagnosis Date   Anxiety    Depression    Known health problems: none     Social History: Social History   Socioeconomic History   Marital status: Single    Spouse name: Not on file   Number of children: Not on file   Years of education: Not on file   Highest education level: Not on file  Occupational History   Not on file  Tobacco Use   Smoking status: Light Smoker    Types: Cigarettes   Smokeless tobacco: Never  Vaping Use   Vaping Use: Never used  Substance and Sexual Activity   Alcohol use: Not Currently   Drug use: Yes    Types: Marijuana, IV   Sexual activity: Not on file  Other Topics Concern   Not on file  Social History Narrative   Not on file   Social Determinants of Health   Financial Resource Strain: Not on file  Food Insecurity: Not on file  Transportation Needs: Not on file  Physical Activity: Not on file  Stress: Not on file  Social Connections: Not on file    CHL HIV PREP FLOWSHEET RESULTS  06/10/2019 03/17/2019  Insurance Status Uninsured Uninsured  How did you hear? - Friends  Gender at birth Male Male  Gender identity cis-Male cis-Male  Risk for HIV - >5 partners in past 6 mos (regardless of condom use);Hx of STI;Condomless vaginal or anal intercourse  Sex Partners Men only Men only  # sex partners past 3-6 mos 1-3 >12  Sex activity preferences Insertive and receptive;Oral Receptive;Oral  Condom use No Yes  % condom use - 50  Treated for STI? No Yes  HIV symptoms? N/A N/A  PrEP Eligibility Substantial risk for HIV Substantial risk for HIV    Labs:  SCr: Lab Results  Component Value Date   CREATININE 1.12 05/01/2021   CREATININE 0.93 03/14/2021   CREATININE 0.84 05/26/2020   CREATININE 0.92 11/10/2019   CREATININE 0.97 03/17/2019   HIV Lab Results  Component Value Date   HIV NON-REACTIVE 10/26/2021   HIV NON-REACTIVE 07/28/2021   HIV NON-REACTIVE 06/21/2021   HIV NON-REACTIVE 03/14/2021   HIV NON-REACTIVE 05/26/2020   Hepatitis B Lab Results  Component Value Date   HEPBSAB REACTIVE (A) 03/17/2019   HEPBSAG NON-REACTIVE 03/17/2019   Hepatitis C Lab Results  Component Value Date   HEPCAB NON-REACTIVE 03/14/2021   Hepatitis A No  results found for: HAV RPR and STI Lab Results  Component Value Date   LABRPR REACTIVE (A) 10/26/2021   LABRPR REACTIVE (A) 07/28/2021   LABRPR NON-REACTIVE 05/26/2020   LABRPR NON-REACTIVE 11/10/2019   LABRPR NON-REACTIVE 03/17/2019   RPRTITER 1:16 (H) 10/26/2021   RPRTITER 1:64 (H) 07/28/2021    STI Results GC CT  10/26/2021 Positive(A) Negative  10/26/2021 Positive(A) Negative  10/26/2021 Negative Negative  07/28/2021 Negative Negative  07/28/2021 Negative Negative  06/21/2021 Negative Negative  06/21/2021 Negative Negative  06/21/2021 Negative Negative  03/14/2021 Negative Negative  03/14/2021 Negative Negative  03/14/2021 Negative Negative  05/26/2020 Negative Negative  05/26/2020 Negative Negative  05/26/2020 Negative  Negative  11/10/2019 Negative Negative  11/10/2019 Negative Negative  03/17/2019 Negative Negative  03/17/2019 Negative Negative  03/17/2019 Negative Negative    Assessment: Dylan Hurst presents to clinic today after his oral and rectal swabs from yesterday's visit resulted positive for gonorrhea. He is not currently having any symptoms and has only been with one partner. We will administer ceftriaxone today. Counseled on abstaining from sexual activity for the next 10 days and condom use. He does not have any questions or concerns today.   Administer Ceftriaxone 500 mg IM x1 dose in right upper outer gluteal muscle. Monitored for 15 minutes post-injection. No issues or concerns.   Plan: Administer Ceftriaxone 500 mg IM x1 dose Follow-up on 01/29/22 @ 9:15 with [place], PharmD PGY2 Infectious Diseases Pharmacy Resident

## 2021-10-27 NOTE — Telephone Encounter (Signed)
Called patient to discuss lab results. Oral and rectal swabs positive for gonorrhea. He is going to come to clinic today at 2pm for Ceftriaxone IM injection x1 dose.  Lestine Box, PharmD PGY2 Infectious Diseases Pharmacy Resident

## 2021-10-30 LAB — RPR: RPR Ser Ql: REACTIVE — AB

## 2021-10-30 LAB — RPR TITER: RPR Titer: 1:16 {titer} — ABNORMAL HIGH

## 2021-10-30 LAB — FLUORESCENT TREPONEMAL AB(FTA)-IGG-BLD: Fluorescent Treponemal ABS: REACTIVE — AB

## 2021-10-30 LAB — HIV ANTIBODY (ROUTINE TESTING W REFLEX): HIV 1&2 Ab, 4th Generation: NONREACTIVE

## 2021-10-31 ENCOUNTER — Other Ambulatory Visit: Payer: Self-pay

## 2021-10-31 ENCOUNTER — Other Ambulatory Visit (HOSPITAL_COMMUNITY): Payer: Self-pay

## 2021-10-31 DIAGNOSIS — Z7252 High risk homosexual behavior: Secondary | ICD-10-CM

## 2021-10-31 MED ORDER — EMTRICITABINE-TENOFOVIR AF 200-25 MG PO TABS
1.0000 | ORAL_TABLET | Freq: Every day | ORAL | 2 refills | Status: DC
  Filled 2021-10-31 – 2021-12-25 (×4): qty 30, 30d supply, fill #0

## 2021-10-31 NOTE — Progress Notes (Signed)
HIV test resulted negative. Will send in Descovy x 3 months to [facility].  Lestine Box, PharmD PGY2 Infectious Diseases Pharmacy Resident

## 2021-11-01 ENCOUNTER — Other Ambulatory Visit (HOSPITAL_COMMUNITY): Payer: Self-pay

## 2021-11-02 ENCOUNTER — Other Ambulatory Visit (HOSPITAL_COMMUNITY): Payer: Self-pay

## 2021-11-06 ENCOUNTER — Other Ambulatory Visit (HOSPITAL_COMMUNITY): Payer: Self-pay

## 2021-11-07 ENCOUNTER — Other Ambulatory Visit (HOSPITAL_COMMUNITY): Payer: Self-pay

## 2021-11-08 ENCOUNTER — Other Ambulatory Visit (HOSPITAL_COMMUNITY): Payer: Self-pay

## 2021-12-01 ENCOUNTER — Telehealth: Payer: Self-pay | Admitting: Family Medicine

## 2021-12-01 NOTE — Telephone Encounter (Signed)
Copied from [place] [phone]. Topic: Appointment Scheduling - Scheduling Inquiry for Clinic >> Nov 30, 2021  2:36 PM P[place], Brayton Layman A wrote: Reason for CRM: Pt wanted to schedule an appt with the social worker, please advise.

## 2021-12-19 ENCOUNTER — Ambulatory Visit: Payer: Self-pay | Admitting: Pharmacist

## 2021-12-25 ENCOUNTER — Other Ambulatory Visit (HOSPITAL_COMMUNITY): Payer: Self-pay

## 2021-12-27 ENCOUNTER — Other Ambulatory Visit: Payer: Self-pay

## 2021-12-27 ENCOUNTER — Other Ambulatory Visit (HOSPITAL_COMMUNITY)
Admission: RE | Admit: 2021-12-27 | Discharge: 2021-12-27 | Disposition: A | Discharge status: Home / Self Care | Payer: Self-pay | Source: Ambulatory Visit | Attending: Infectious Disease | Admitting: Infectious Disease

## 2021-12-27 ENCOUNTER — Other Ambulatory Visit (HOSPITAL_COMMUNITY): Payer: Self-pay

## 2021-12-27 ENCOUNTER — Ambulatory Visit (INDEPENDENT_AMBULATORY_CARE_PROVIDER_SITE_OTHER): Payer: Self-pay | Admitting: Pharmacist

## 2021-12-27 DIAGNOSIS — Z79899 Other long term (current) drug therapy: Secondary | ICD-10-CM

## 2021-12-27 DIAGNOSIS — Z113 Encounter for screening for infections with a predominantly sexual mode of transmission: Secondary | ICD-10-CM

## 2021-12-27 NOTE — Progress Notes (Signed)
Date:  12/27/2021   HPI: Dylan Hurst is a 36 y.o. male who presents to the [place] clinic for HIV PrEP follow-up.  Insured   []    Uninsured  [x]    Patient Active Problem List   Diagnosis Date Noted   Generalized anxiety disorder 07/25/2021   History of substance use 07/25/2021   On pre-exposure prophylaxis for HIV 03/14/2021   Screening for STDs (sexually transmitted diseases) 03/14/2021   Need for hepatitis C screening test 03/14/2021    Patient's Medications  New Prescriptions   No medications on file  Previous Medications   DOXYCYCLINE (VIBRA-TABS) 100 MG TABLET    Take 1 tablet by mouth 2 times daily.   EMTRICITABINE-TENOFOVIR AF (DESCOVY) 200-25 MG TABLET    Take 1 tablet by mouth daily.   SERTRALINE (ZOLOFT) 50 MG TABLET    TAKE 1 TABLET BY MOUTH DAILY  Modified Medications   No medications on file  Discontinued Medications   No medications on file    Allergies: No Known Allergies  Past Medical History: Past Medical History:  Diagnosis Date   Anxiety    Depression    Known health problems: none     Social History: Social History   Socioeconomic History   Marital status: Single    Spouse name: Not on file   Number of children: Not on file   Years of education: Not on file   Highest education level: Not on file  Occupational History   Not on file  Tobacco Use   Smoking status: Light Smoker    Types: Cigarettes   Smokeless tobacco: Never  Vaping Use   Vaping Use: Never used  Substance and Sexual Activity   Alcohol use: Not Currently   Drug use: Yes    Types: Marijuana, IV   Sexual activity: Not on file  Other Topics Concern   Not on file  Social History Narrative   Not on file   Social Determinants of Health   Financial Resource Strain: Not on file  Food Insecurity: Not on file  Transportation Needs: Not on file  Physical Activity: Not on file  Stress: Not on file  Social Connections: Not on file    CHL HIV PREP FLOWSHEET  RESULTS 06/10/2019 03/17/2019  Insurance Status Uninsured Uninsured  How did you hear? - Friends  Gender at birth Male Male  Gender identity cis-Male cis-Male  Risk for HIV - >5 partners in past 6 mos (regardless of condom use);Hx of STI;Condomless vaginal or anal intercourse  Sex Partners Men only Men only  # sex partners past 3-6 mos 1-3 >12  Sex activity preferences Insertive and receptive;Oral Receptive;Oral  Condom use No Yes  % condom use - 50  Treated for STI? No Yes  HIV symptoms? N/A N/A  PrEP Eligibility Substantial risk for HIV Substantial risk for HIV    Labs:  SCr: Lab Results  Component Value Date   CREATININE 1.12 05/01/2021   CREATININE 0.93 03/14/2021   CREATININE 0.84 05/26/2020   CREATININE 0.92 11/10/2019   CREATININE 0.97 03/17/2019   HIV Lab Results  Component Value Date   HIV NON-REACTIVE 10/26/2021   HIV NON-REACTIVE 07/28/2021   HIV NON-REACTIVE 06/21/2021   HIV NON-REACTIVE 03/14/2021   HIV NON-REACTIVE 05/26/2020   Hepatitis B Lab Results  Component Value Date   HEPBSAB REACTIVE (A) 03/17/2019   HEPBSAG NON-REACTIVE 03/17/2019   Hepatitis C Lab Results  Component Value Date   HEPCAB NON-REACTIVE 03/14/2021   Hepatitis A  No results found for: HAV RPR and STI Lab Results  Component Value Date   LABRPR REACTIVE (A) 10/26/2021   LABRPR REACTIVE (A) 07/28/2021   LABRPR NON-REACTIVE 05/26/2020   LABRPR NON-REACTIVE 11/10/2019   LABRPR NON-REACTIVE 03/17/2019   RPRTITER 1:16 (H) 10/26/2021   RPRTITER 1:64 (H) 07/28/2021    STI Results GC CT  10/26/2021 Positive(A) Negative  10/26/2021 Positive(A) Negative  10/26/2021 Negative Negative  07/28/2021 Negative Negative  07/28/2021 Negative Negative  06/21/2021 Negative Negative  06/21/2021 Negative Negative  06/21/2021 Negative Negative  03/14/2021 Negative Negative  03/14/2021 Negative Negative  03/14/2021 Negative Negative  05/26/2020 Negative Negative  05/26/2020 Negative Negative  05/26/2020  Negative Negative  11/10/2019 Negative Negative  11/10/2019 Negative Negative  03/17/2019 Negative Negative  03/17/2019 Negative Negative  03/17/2019 Negative Negative    Assessment: Dylan Hurst is here today for STI testing. He is currently taking Descovy for PrEP and does not miss any doses.  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 He was then seen in November for routine PrEP follow up and tested positive for gonorrhea. He was treated with ceftriaxone. He did not have any symptoms at that time.   He comes in today and states that he felt like a cold sore was about to emerge but it never did. Reports feeling tingling and "weird" on his lip. He has never had a cold sore before. He denies fevers, sore throat, rashes, penile/rectal discharge or pain, or trouble urinating. No other symptoms of note. He has only had one partner since he was last tested in November. It is the same partner he was with before testing positive for gonorrhea so he is worried he is positive again. He has had oral and rectal sex with this partner but no condoms were used. Will check for all STIs today and call him if anything returns positive.  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 He now works for [employer]. Since he is not traveling anymore, he is interested in starting Apretude.  Will have him meet with Butch Penny to sign paperwork to get started.   He is also interested in hormone replacement therapy and transgender care. He is uninsured, so I am not sure if endocrinology will accept him as a patient. Will schedule him with one of our ID NPs to discuss starting therapy or for a referral to be seen to get that process started. It looks like both estradiol and spironolactone tablets are affordable via GoodRx coupon. Will keep his appointment with me for next month.   Plan: - HIV antibody, RPR,  urine/rectal/pharyngeal GC/CT swabs for cytology today - Continue Descovy every day - Apply for Apretude patient assistance - F/u with Colletta Maryland on 01/10/22 to discuss HRT - F/u with me on 01/29/22 or earlier if starting Apretude for HIV PrEP care  Arrow Tomko L. Kaylon Laroche, PharmD, BCIDP, AAHIVP, CPP Clinical Pharmacist Practitioner [facility] 12/27/2021, 3:01 PM

## 2021-12-28 ENCOUNTER — Telehealth: Payer: Self-pay

## 2021-12-28 LAB — CYTOLOGY, (ORAL, ANAL, URETHRAL) ANCILLARY ONLY
Chlamydia: NEGATIVE
Chlamydia: NEGATIVE
Comment: NEGATIVE
Comment: NEGATIVE
Comment: NORMAL
Comment: NORMAL
Neisseria Gonorrhea: NEGATIVE
Neisseria Gonorrhea: NEGATIVE

## 2021-12-28 NOTE — Telephone Encounter (Signed)
[facility] Patient Advocate Encounter  Completed and sent [employer] application for Apretude for this patient who is uninsured.    Patient is approved 12/28/21 through 12/27/22.  Medication will be delivered from [facility] to the Office.   Ileene Patrick, [facility] Patient [facility] for [facility] Disease Phone: [phone] Fax:  [phone]

## 2021-12-29 LAB — URINE CYTOLOGY ANCILLARY ONLY
Chlamydia: NEGATIVE
Comment: NEGATIVE
Comment: NORMAL
Neisseria Gonorrhea: NEGATIVE

## 2021-12-29 LAB — RPR: RPR Ser Ql: REACTIVE — AB

## 2021-12-29 LAB — RPR TITER: RPR Titer: 1:2 {titer} — ABNORMAL HIGH

## 2021-12-29 LAB — HIV ANTIBODY (ROUTINE TESTING W REFLEX): HIV 1&2 Ab, 4th Generation: NONREACTIVE

## 2021-12-29 LAB — FLUORESCENT TREPONEMAL AB(FTA)-IGG-BLD: Fluorescent Treponemal ABS: REACTIVE — AB

## 2022-01-02 NOTE — Telephone Encounter (Signed)
I spoke with pt and scheduled appt for 01/22/22.

## 2022-01-04 ENCOUNTER — Other Ambulatory Visit: Payer: Self-pay

## 2022-01-04 ENCOUNTER — Ambulatory Visit: Payer: Self-pay | Admitting: Pharmacist

## 2022-01-04 ENCOUNTER — Telehealth: Payer: Self-pay

## 2022-01-04 ENCOUNTER — Ambulatory Visit (INDEPENDENT_AMBULATORY_CARE_PROVIDER_SITE_OTHER): Payer: Self-pay | Admitting: Pharmacist

## 2022-01-04 DIAGNOSIS — Z79899 Other long term (current) drug therapy: Secondary | ICD-10-CM

## 2022-01-04 MED ORDER — CABOTEGRAVIR ER 600 MG/3ML IM SUER
600.0000 mg | Freq: Once | INTRAMUSCULAR | Status: AC
  Administered 2022-01-04: 600 mg via INTRAMUSCULAR

## 2022-01-04 NOTE — Progress Notes (Signed)
HPI: Dylan Hurst is a 36 y.o. male who presents to the [place] clinic for Apretude administration and HIV PrEP follow up.  Patient Active Problem List   Diagnosis Date Noted   Generalized anxiety disorder 07/25/2021   History of substance use 07/25/2021   On pre-exposure prophylaxis for HIV 03/14/2021   Screening for STDs (sexually transmitted diseases) 03/14/2021   Need for hepatitis C screening test 03/14/2021    Patient's Medications  New Prescriptions   No medications on file  Previous Medications   DOXYCYCLINE (VIBRA-TABS) 100 MG TABLET    Take 1 tablet by mouth 2 times daily.   EMTRICITABINE-TENOFOVIR AF (DESCOVY) 200-25 MG TABLET    Take 1 tablet by mouth daily.   SERTRALINE (ZOLOFT) 50 MG TABLET    TAKE 1 TABLET BY MOUTH DAILY  Modified Medications   No medications on file  Discontinued Medications   No medications on file    Allergies: No Known Allergies  Past Medical History: Past Medical History:  Diagnosis Date   Anxiety    Depression    Known health problems: none     Social History: Social History   Socioeconomic History   Marital status: Single    Spouse name: Not on file   Number of children: Not on file   Years of education: Not on file   Highest education level: Not on file  Occupational History   Not on file  Tobacco Use   Smoking status: Light Smoker    Types: Cigarettes   Smokeless tobacco: Never  Vaping Use   Vaping Use: Never used  Substance and Sexual Activity   Alcohol use: Not Currently   Drug use: Yes    Types: Marijuana, IV   Sexual activity: Not on file  Other Topics Concern   Not on file  Social History Narrative   Not on file   Social Determinants of Health   Financial Resource Strain: Not on file  Food Insecurity: Not on file  Transportation Needs: Not on file  Physical Activity: Not on file  Stress: Not on file  Social Connections: Not on file    Labs: Lab Results  Component Value Date    HIV1RNAQUANT Not Detected 07/28/2021    RPR and STI Lab Results  Component Value Date   LABRPR REACTIVE (A) 12/27/2021   LABRPR REACTIVE (A) 10/26/2021   LABRPR REACTIVE (A) 07/28/2021   LABRPR NON-REACTIVE 05/26/2020   LABRPR NON-REACTIVE 11/10/2019   RPRTITER 1:2 (H) 12/27/2021   RPRTITER 1:16 (H) 10/26/2021   RPRTITER 1:64 (H) 07/28/2021    STI Results GC CT  12/27/2021 Negative Negative  12/27/2021 Negative Negative  12/27/2021 Negative Negative  10/26/2021 Positive(A) Negative  10/26/2021 Positive(A) Negative  10/26/2021 Negative Negative  07/28/2021 Negative Negative  07/28/2021 Negative Negative  06/21/2021 Negative Negative  06/21/2021 Negative Negative  06/21/2021 Negative Negative  03/14/2021 Negative Negative  03/14/2021 Negative Negative  03/14/2021 Negative Negative  05/26/2020 Negative Negative  05/26/2020 Negative Negative  05/26/2020 Negative Negative  11/10/2019 Negative Negative  11/10/2019 Negative Negative  03/17/2019 Negative Negative    Hepatitis B Lab Results  Component Value Date   HEPBSAB REACTIVE (A) 03/17/2019   HEPBSAG NON-REACTIVE 03/17/2019   Hepatitis C Lab Results  Component Value Date   HEPCAB NON-REACTIVE 03/14/2021   Hepatitis A No results found for: HAV Lipids: No results found for: CHOL, TRIG, HDL, CHOLHDL, VLDL, LDLCALC  Current PrEP Regimen: Descovy  TARGET DATE: The 12th of the month  Assessment: Dylan Hurst  presents today for their first initiation injection of Apretude and to follow up for HIV PrEP.  Counseled that Apretude is one intramuscular injection in the gluteal muscle for each visit. Explained that the second injection is 30 days after the initial injection then every 2 months thereafter. Discussed follow up appointments moving forward. It is required to have a negative HIV test immediately prior to injection administration. A rapid HIV blood test will be drawn each visit, and patient must wait for results before getting injection,  which typically takes 20-30 minutes. Will make lab appointments for each injection 30 minutes prior to injection appointment. Screened patient for acute HIV symptoms such as fatigue, muscle aches, rash, sore throat, lymphadenopathy, headache, night sweats, nausea/vomiting/diarrhea, and fever. Patient denies any symptoms.   Explained that showing up to injection appointments is very important and warned that if appointments are missed, protection will be minimal and the risk of acquiring HIV becomes much higher. Counseled on possible side effects associated with the injections such as injection site pain, which is usually mild to moderate in nature, injection site nodules, and injection site reactions. Asked to call the clinic or send me a mychart message if they experience any issues. Advised that they can take Motrin or Tylenol for injection site pain if needed. They may also pre-treat with Motrin or Tylenol 30-45 minutes before scheduled appointments.   Rapid HIV antibody blood test was drawn immediately prior to injection and was negative. Patient also had HIV RNA drawn today. Administered cabotegravir 600mg /33mL in left upper outer quadrant of the gluteal muscle. Monitored patient for 10 minutes after injection. Injection was tolerated well without issue. Counseled to stop taking Descovy after today's dose and to call with any issues that may arise. Also counseled that condoms are still advised and encouraged. Will make follow up appointments for second initiation injection in 30 days and then maintenance injections every 2 months thereafter.   Plan: - Stop Descovy - First Apretude injection administered - Second initiation injection scheduled for 02/06/22 - Call with any issues or questions  Michela Pitcher) [place], PharmD Student

## 2022-01-04 NOTE — Telephone Encounter (Signed)
[facility] Patient Advocate Encounter  Patient's medication (Cabenuva)have been couriered to [facility] from [employer] and will be administered on patient next office visit on 01/04/22.  Ileene Patrick , [facility] Patient [facility] for Infectious Disease Phone: [phone] Fax:  [phone]

## 2022-01-05 ENCOUNTER — Encounter: Payer: Self-pay | Admitting: Pharmacist

## 2022-01-06 LAB — HIV-1 RNA QUANT-NO REFLEX-BLD
HIV 1 RNA Quant: NOT DETECTED Copies/mL
HIV-1 RNA Quant, Log: NOT DETECTED Log cps/mL

## 2022-01-10 ENCOUNTER — Ambulatory Visit: Payer: Self-pay | Admitting: Infectious Diseases

## 2022-01-17 ENCOUNTER — Other Ambulatory Visit (HOSPITAL_COMMUNITY): Payer: Self-pay

## 2022-01-22 ENCOUNTER — Ambulatory Visit: Payer: Self-pay | Attending: Internal Medicine | Admitting: Clinical

## 2022-01-26 ENCOUNTER — Telehealth: Payer: Self-pay

## 2022-01-26 NOTE — Telephone Encounter (Signed)
[facility] Patient Advocate Encounter  Patient's medication(Apretude) have been couriered to [facility] from [employer] and will be administered on patient next office visit on 02/06/22.  Ileene Patrick , [facility] Patient [facility] for Infectious Disease Phone: [phone] Fax:  [phone]

## 2022-01-29 ENCOUNTER — Ambulatory Visit: Payer: Self-pay | Admitting: Pharmacist

## 2022-02-06 ENCOUNTER — Encounter: Payer: Self-pay | Admitting: Pharmacist

## 2022-02-06 ENCOUNTER — Other Ambulatory Visit: Payer: Self-pay

## 2022-02-06 ENCOUNTER — Ambulatory Visit: Payer: Self-pay | Admitting: Pharmacist

## 2022-02-14 ENCOUNTER — Ambulatory Visit: Payer: Self-pay | Admitting: Infectious Diseases

## 2022-03-08 ENCOUNTER — Other Ambulatory Visit: Payer: Self-pay

## 2022-03-08 ENCOUNTER — Ambulatory Visit (INDEPENDENT_AMBULATORY_CARE_PROVIDER_SITE_OTHER): Payer: Self-pay | Admitting: Pharmacist

## 2022-03-08 DIAGNOSIS — Z79899 Other long term (current) drug therapy: Secondary | ICD-10-CM

## 2022-03-08 DIAGNOSIS — Z113 Encounter for screening for infections with a predominantly sexual mode of transmission: Secondary | ICD-10-CM

## 2022-03-08 MED ORDER — APRETUDE 600 MG/3ML IM SUER: 600.0000 mg | INTRAMUSCULAR | 5 refills | Status: AC

## 2022-03-08 MED ORDER — CABOTEGRAVIR ER 600 MG/3ML IM SUER
600.0000 mg | Freq: Once | INTRAMUSCULAR | Status: AC
  Administered 2022-03-08: 600 mg via INTRAMUSCULAR

## 2022-03-08 NOTE — Progress Notes (Signed)
? ?HPI: Dylan Hurst is a 36 y.o. male who presents to the [place] clinic for Apretude administration and HIV PrEP follow up. ? ?Patient Active Problem List  ? Diagnosis Date Noted  ? Generalized anxiety disorder 07/25/2021  ? History of substance use 07/25/2021  ? On pre-exposure prophylaxis for HIV 03/14/2021  ? Screening for STDs (sexually transmitted diseases) 03/14/2021  ? Need for hepatitis C screening test 03/14/2021  ? ? ?Patient's Medications  ?New Prescriptions  ? No medications on file  ?Previous Medications  ? DOXYCYCLINE (VIBRA-TABS) 100 MG TABLET    Take 1 tablet by mouth 2 times daily.  ? EMTRICITABINE-TENOFOVIR AF (DESCOVY) 200-25 MG TABLET    Take 1 tablet by mouth daily.  ? SERTRALINE (ZOLOFT) 50 MG TABLET    TAKE 1 TABLET BY MOUTH DAILY  ?Modified Medications  ? No medications on file  ?Discontinued Medications  ? No medications on file  ? ? ?Allergies: ?No Known Allergies ? ?Past Medical History: ?Past Medical History:  ?Diagnosis Date  ? Anxiety   ? Depression   ? Known health problems: none   ? ? ?Social History: ?Social History  ? ?Socioeconomic History  ? Marital status: Single  ?  Spouse name: Not on file  ? Number of children: Not on file  ? Years of education: Not on file  ? Highest education level: Not on file  ?Occupational History  ? Not on file  ?Tobacco Use  ? Smoking status: Light Smoker  ?  Types: Cigarettes  ? Smokeless tobacco: Never  ?Vaping Use  ? Vaping Use: Never used  ?Substance and Sexual Activity  ? Alcohol use: Not Currently  ? Drug use: Yes  ?  Types: Marijuana, IV  ? Sexual activity: Not on file  ?Other Topics Concern  ? Not on file  ?Social History Narrative  ? Not on file  ? ?Social Determinants of Health  ? ?Financial Resource Strain: Not on file  ?Food Insecurity: Not on file  ?Transportation Needs: Not on file  ?Physical Activity: Not on file  ?Stress: Not on file  ?Social Connections: Not on file  ? ? ?Labs: ?Lab Results  ?Component Value Date  ?  HIV1RNAQUANT Not Detected 01/04/2022  ? HIV1RNAQUANT Not Detected 07/28/2021  ? ? ?RPR and STI ?Lab Results  ?Component Value Date  ? LABRPR REACTIVE (A) 12/27/2021  ? LABRPR REACTIVE (A) 10/26/2021  ? LABRPR REACTIVE (A) 07/28/2021  ? LABRPR NON-REACTIVE 05/26/2020  ? LABRPR NON-REACTIVE 11/10/2019  ? RPRTITER 1:2 (H) 12/27/2021  ? RPRTITER 1:16 (H) 10/26/2021  ? RPRTITER 1:64 (H) 07/28/2021  ? ? ?STI Results GC CT  ?12/27/2021 Negative Negative  ?12/27/2021 Negative Negative  ?12/27/2021 Negative Negative  ?10/26/2021 Positive(A) Negative  ?10/26/2021 Positive(A) Negative  ?10/26/2021 Negative Negative  ?07/28/2021 Negative Negative  ?07/28/2021 Negative Negative  ?06/21/2021 Negative Negative  ?06/21/2021 Negative Negative  ?06/21/2021 Negative Negative  ?03/14/2021 Negative Negative  ?03/14/2021 Negative Negative  ?03/14/2021 Negative Negative  ?05/26/2020 Negative Negative  ?05/26/2020 Negative Negative  ?05/26/2020 Negative Negative  ?11/10/2019 Negative Negative  ?11/10/2019 Negative Negative  ?03/17/2019 Negative Negative  ? ? ?Hepatitis B ?Lab Results  ?Component Value Date  ? HEPBSAB REACTIVE (A) 03/17/2019  ? HEPBSAG NON-REACTIVE 03/17/2019  ? ?Hepatitis C ?Lab Results  ?Component Value Date  ? HEPCAB NON-REACTIVE 03/14/2021  ? ?Hepatitis A ?No results found for: HAV ?Lipids: ?No results found for: CHOL, TRIG, HDL, CHOLHDL, VLDL, LDLCALC ? ?TARGET DATE: The 16th of the month ? ?Current  PrEP Regimen: ?Apretude ? ?Assessment: ?Dylan Hurst presents today for restart of their first initiation injection of Apretude and to follow up for HIV PrEP. ? ?Dylan Hurst's last injection was in January. He missed his other Apretude injections in both February and March. He stated this was due to Mental Health (Depression and Anxiety) and transportation issues. He denies attending any counseling or taking any medications for his Mental Health. Today he says he is feeling much better and like himself. In addition, he now has a car and that  transportation is no longer a barrier.  ? ?Discussed with [name] the importance of adherence. Moreover, the pros/cons with PO vs IM medication for PrEP. He remains adamant PO is not a great option for him and that he would like to trial the injection again. Counseled that Apretude is one intramuscular injection in the gluteal muscle for each visit. Explained that the second injection is 30 days after the initial injection then every 2 months thereafter. Discussed follow up appointments moving forward. It is required to have a negative HIV test immediately prior to injection administration. A rapid HIV blood test will be drawn each visit, and patient must wait for results before getting injection, which typically takes 20-30 minutes. Will make lab appointments for each injection 30 minutes prior to injection appointment. Screened patient for acute HIV symptoms such as fatigue, muscle aches, rash, sore throat, lymphadenopathy, headache, night sweats, nausea/vomiting/diarrhea, and fever. Patient denies any symptoms.  ? ?Explained that showing up to injection appointments is very important and warned that if appointments are missed, protection will be minimal and the risk of acquiring HIV becomes much higher. Counseled on possible side effects associated with the injections such as injection site pain, which is usually mild to moderate in nature, injection site nodules, and injection site reactions. Asked to call the clinic or send me a mychart message if they experience any issues. Advised that they can take Motrin or Tylenol for injection site pain if needed. They may also pre-treat with Motrin or Tylenol about 30-45 minutes before scheduled appointments.  ? ?At this time will reinitiate therapy with the caveat if he misses another planned injection that he will go back on orals. He acknowledged the counseling and the plan.  ? ?Administered cabotegravir '600mg'$ /71m in Right upper outer quadrant of the gluteal muscle.  Monitored patient for 10 minutes after injection. Injections were tolerated well without issue.  Will make follow up appointments for second initiation injection in 30 days and then maintenance injections every 2 months thereafter.  ? ?Reset up his Mychart- he now has access. ? ?Plan: ?- First Apretude injection administered ?- Second initiation injection scheduled for 04/03/22 ?- F/U HIV RNA ?- If misses April injection: restart on PO medications ?- Call with any issues or questions ? ?Thank you for allowing pharmacy to be apart of this patient's care ? ?AAsher Muir PharmD Candidate ?

## 2022-03-09 ENCOUNTER — Other Ambulatory Visit: Payer: Self-pay

## 2022-03-09 ENCOUNTER — Ambulatory Visit (INDEPENDENT_AMBULATORY_CARE_PROVIDER_SITE_OTHER): Payer: No Typology Code available for payment source | Admitting: Plastic Surgery

## 2022-03-09 ENCOUNTER — Encounter: Payer: Self-pay | Admitting: Plastic Surgery

## 2022-03-09 VITALS — BP 130/86 | HR 77 | Ht 66.0 in | Wt 153.0 lb

## 2022-03-09 DIAGNOSIS — S0993XA Unspecified injury of face, initial encounter: Secondary | ICD-10-CM | POA: Diagnosis not present

## 2022-03-11 NOTE — Progress Notes (Signed)
? ?  Referring Provider ?No referring provider defined for this encounter.  ? ?CC:  ?Right upper eyelid swelling ? ?Dylan Hurst is an 36 y.o. male.  ?HPI: Patient is a 36 year old with right upper eyelid swelling that has been present for 5 years he seen multiple other providers and had some injury imaging including [facility] [place].  He has not received a diagnosis for his condition.  He states that this happened after a soccer injury to the right face.  He was struck by a soccer ball. ? ?No Known Allergies ? ?Outpatient Encounter Medications as of 03/09/2022  ?Medication Sig  ? cabotegravir ER (APRETUDE) 600 MG/3ML injection Inject 3 mLs (600 mg total) into the muscle every 2 (two) months.  ? [DISCONTINUED] sertraline (ZOLOFT) 50 MG tablet TAKE 1 TABLET BY MOUTH DAILY  ? ?No facility-administered encounter medications on file as of 03/09/2022.  ?  ? ?Past Medical History:  ?Diagnosis Date  ? Anxiety   ? Depression   ? Known health problems: none   ? ? ?Past Surgical History:  ?Procedure Laterality Date  ? no past surgery    ? ? ?Family History  ?Problem Relation Age of Onset  ? Thyroid disease Mother   ? Diabetes Father   ? ? ?Social History  ? ?Social History Narrative  ? Not on file  ?  ? ?Review of Systems ?General: Denies fevers, chills, weight loss ?CV: Denies chest pain, shortness of breath, palpitations ? ? ?Physical Exam ?Vitals with BMI 03/09/2022 05/01/2021 05/01/2021  ?Height '5\' 6"'$  - -  ?Weight 153 lbs - -  ?BMI 24.71 - -  ?Systolic 242 683 419  ?Diastolic 86 98 622  ?Pulse 77 114 90  ?  ?General:  No acute distress,  Alert and oriented, Non-Toxic, Normal speech and affect ?HEENT: Right upper eyelid swelling with mass in the pretarsal space. ? ?Assessment/Plan ?Patient with right upper eyelid swelling which appears to have been present for 5 years.  He claims to have other imaging including CT scan.  I would like to obtain these images prior to any decision making. ?

## 2022-03-12 LAB — FLUORESCENT TREPONEMAL AB(FTA)-IGG-BLD: Fluorescent Treponemal ABS: REACTIVE — AB

## 2022-03-12 LAB — RPR TITER: RPR Titer: 1:1 {titer} — ABNORMAL HIGH

## 2022-03-12 LAB — HIV-1 RNA QUANT-NO REFLEX-BLD
HIV 1 RNA Quant: NOT DETECTED Copies/mL
HIV-1 RNA Quant, Log: NOT DETECTED Log cps/mL

## 2022-03-12 LAB — RPR: RPR Ser Ql: REACTIVE — AB

## 2022-03-21 ENCOUNTER — Telehealth: Payer: Self-pay | Admitting: Pharmacist

## 2022-03-21 NOTE — Telephone Encounter (Signed)
Patient's medication (Apretude) has been delivered to [facility] from [facility] and will be administered at patient's next office visit on 04/03/22. ? ?Shell Yandow L. Elzina Devera, PharmD ?[facility] Clinical Pharmacist Practitioner  ?

## 2022-03-26 ENCOUNTER — Ambulatory Visit (INDEPENDENT_AMBULATORY_CARE_PROVIDER_SITE_OTHER): Payer: No Typology Code available for payment source | Admitting: Plastic Surgery

## 2022-03-26 DIAGNOSIS — D489 Neoplasm of uncertain behavior, unspecified: Secondary | ICD-10-CM

## 2022-03-26 DIAGNOSIS — S0993XA Unspecified injury of face, initial encounter: Secondary | ICD-10-CM

## 2022-03-26 DIAGNOSIS — D3191 Benign neoplasm of unspecified part of right eye: Secondary | ICD-10-CM

## 2022-03-28 NOTE — Progress Notes (Signed)
? ?  Referring Provider ?Antony Blackbird, MD ?[address], [address] ?[place],  [place] [address]  ? ?CC: Right orbital swelling ? ?Dylan Hurst is an 36 y.o. male.  ?HPI: Patient is a 36 year old with history of swelling of the right orbit.  I discussed this case with oculoplastic colleagues and the plan is to get a CT scan to evaluate this and determine whether it is a neoplasm or the result of trauma. ? ?Review of Systems ?General: No fever chills or unintentional weight loss. ? ?Physical Exam ?Phone visit. ? ?Assessment/Plan ?Neck step is to get an orbit CT and determine the nature of this mass more fully.  Patient is agreeable with this.  Depending on the characteristics of the mass on CT scan we will determine the next step. ? ? ?Time based coding phone visit: ?Time on call:  6 min ?Call type: voice ?Patient location: home ?Physician location:  office ? ? ?Lennice Sites ?03/28/2022, 10:33 AM  ? ? ?    ? ?  ?

## 2022-03-29 NOTE — Addendum Note (Signed)
Addended by: Lennice Sites on: 03/29/2022 03:54 PM ? ? Modules accepted: Orders ? ?

## 2022-04-03 ENCOUNTER — Other Ambulatory Visit: Payer: Self-pay

## 2022-04-03 ENCOUNTER — Ambulatory Visit (HOSPITAL_BASED_OUTPATIENT_CLINIC_OR_DEPARTMENT_OTHER)
Admission: RE | Admit: 2022-04-03 | Discharge: 2022-04-03 | Disposition: A | Discharge status: Home / Self Care | Payer: No Typology Code available for payment source | Source: Ambulatory Visit | Attending: Plastic Surgery | Admitting: Plastic Surgery

## 2022-04-03 ENCOUNTER — Encounter (HOSPITAL_BASED_OUTPATIENT_CLINIC_OR_DEPARTMENT_OTHER): Payer: Self-pay

## 2022-04-03 ENCOUNTER — Ambulatory Visit: Payer: Self-pay | Admitting: Pharmacist

## 2022-04-03 DIAGNOSIS — D3191 Benign neoplasm of unspecified part of right eye: Secondary | ICD-10-CM | POA: Insufficient documentation

## 2022-04-03 MED ORDER — IOHEXOL 300 MG/ML  SOLN
100.0000 mL | Freq: Once | INTRAMUSCULAR | Status: AC | PRN
  Administered 2022-04-03: 75 mL via INTRAVENOUS

## 2022-04-04 ENCOUNTER — Encounter: Payer: Self-pay | Admitting: Pharmacist

## 2022-04-04 ENCOUNTER — Telehealth (INDEPENDENT_AMBULATORY_CARE_PROVIDER_SITE_OTHER): Payer: No Typology Code available for payment source | Admitting: Plastic Surgery

## 2022-04-04 DIAGNOSIS — D489 Neoplasm of uncertain behavior, unspecified: Secondary | ICD-10-CM

## 2022-04-04 NOTE — Addendum Note (Signed)
Addended by: Lennice Sites on: 04/04/2022 10:58 AM ? ? Modules accepted: Level of Service ? ?

## 2022-04-04 NOTE — Telephone Encounter (Signed)
Patient has completed orbital CT scan. ? ?Physical exam ?Phone visit ? 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 ?Assessment and plan ?Discussed the results of the scan with the patient and also spoke with Dr. Sarajane Marek about this patient.  He is agreeable to see the patient. ? ?Time based coding phone visit: ?Time on call:  5 min ?Call type: voice ?Patient location: home ?Physician location:  office ? ?

## 2022-04-05 ENCOUNTER — Other Ambulatory Visit: Payer: Self-pay

## 2022-04-05 ENCOUNTER — Ambulatory Visit (INDEPENDENT_AMBULATORY_CARE_PROVIDER_SITE_OTHER): Payer: No Typology Code available for payment source | Admitting: Pharmacist

## 2022-04-05 ENCOUNTER — Other Ambulatory Visit: Payer: No Typology Code available for payment source

## 2022-04-05 DIAGNOSIS — Z79899 Other long term (current) drug therapy: Secondary | ICD-10-CM

## 2022-04-05 DIAGNOSIS — Z113 Encounter for screening for infections with a predominantly sexual mode of transmission: Secondary | ICD-10-CM

## 2022-04-05 MED ORDER — CABOTEGRAVIR ER 600 MG/3ML IM SUER
600.0000 mg | Freq: Once | INTRAMUSCULAR | Status: AC
  Administered 2022-04-05: 600 mg via INTRAMUSCULAR

## 2022-04-05 NOTE — Progress Notes (Signed)
? ?HPI: Dylan Hurst is a 36 y.o. male who presents to the [place] clinic for Apretude administration and HIV PrEP follow up. ? ?Patient Active Problem List  ? Diagnosis Date Noted  ? Generalized anxiety disorder 07/25/2021  ? History of substance use 07/25/2021  ? On pre-exposure prophylaxis for HIV 03/14/2021  ? Screening for STDs (sexually transmitted diseases) 03/14/2021  ? Need for hepatitis C screening test 03/14/2021  ? ? ?Patient's Medications  ?New Prescriptions  ? No medications on file  ?Previous Medications  ? CABOTEGRAVIR ER (APRETUDE) 600 MG/3ML INJECTION    Inject 3 mLs (600 mg total) into the muscle every 2 (two) months.  ?Modified Medications  ? No medications on file  ?Discontinued Medications  ? No medications on file  ? ? ?Allergies: ?No Known Allergies ? ?Past Medical History: ?Past Medical History:  ?Diagnosis Date  ? Anxiety   ? Depression   ? Known health problems: none   ? ? ?Social History: ?Social History  ? ?Socioeconomic History  ? Marital status: Single  ?  Spouse name: Not on file  ? Number of children: Not on file  ? Years of education: Not on file  ? Highest education level: Not on file  ?Occupational History  ? Not on file  ?Tobacco Use  ? Smoking status: Light Smoker  ?  Types: Cigarettes  ? Smokeless tobacco: Never  ?Vaping Use  ? Vaping Use: Never used  ?Substance and Sexual Activity  ? Alcohol use: Not Currently  ? Drug use: Yes  ?  Types: Marijuana, IV  ? Sexual activity: Not on file  ?Other Topics Concern  ? Not on file  ?Social History Narrative  ? Not on file  ? ?Social Determinants of Health  ? ?Financial Resource Strain: Not on file  ?Food Insecurity: Not on file  ?Transportation Needs: Not on file  ?Physical Activity: Not on file  ?Stress: Not on file  ?Social Connections: Not on file  ? ? ?Labs: ?Lab Results  ?Component Value Date  ? HIV1RNAQUANT Not Detected 03/08/2022  ? HIV1RNAQUANT Not Detected 01/04/2022  ? HIV1RNAQUANT Not Detected 07/28/2021   ? ? ?RPR and STI ?Lab Results  ?Component Value Date  ? LABRPR REACTIVE (A) 03/08/2022  ? LABRPR REACTIVE (A) 12/27/2021  ? LABRPR REACTIVE (A) 10/26/2021  ? LABRPR REACTIVE (A) 07/28/2021  ? LABRPR NON-REACTIVE 05/26/2020  ? RPRTITER 1:1 (H) 03/08/2022  ? RPRTITER 1:2 (H) 12/27/2021  ? RPRTITER 1:16 (H) 10/26/2021  ? RPRTITER 1:64 (H) 07/28/2021  ? ? ?STI Results GC CT  ?12/27/2021 ? 3:05 PM Negative    ? Negative    ? Negative   Negative    ? Negative    ? Negative    ?10/26/2021 ?12:17 PM Negative    ? Positive    ? Positive   Negative    ? Negative    ? Negative    ?07/28/2021 ?11:25 AM Negative    ? Negative   Negative    ? Negative    ?06/21/2021 ? 3:09 PM Negative    ? Negative    ? Negative   Negative    ? Negative    ? Negative    ?03/14/2021 ?10:09 AM Negative    ? Negative    ? Negative   Negative    ? Negative    ? Negative    ?05/26/2020 ? 2:52 PM Negative    ? Negative    ? Negative  Negative    ? Negative    ? Negative    ?11/10/2019 ? 1:52 PM Negative    ? Negative   Negative    ? Negative    ?03/17/2019 ?12:00 AM Negative    ? Negative    ? Negative   Negative    ? Negative    ? Negative    ? ? ?Hepatitis B ?Lab Results  ?Component Value Date  ? HEPBSAB REACTIVE (A) 03/17/2019  ? HEPBSAG NON-REACTIVE 03/17/2019  ? ?Hepatitis C ?Lab Results  ?Component Value Date  ? HEPCAB NON-REACTIVE 03/14/2021  ? ?Hepatitis A ?No results found for: HAV ?Lipids: ?No results found for: CHOL, TRIG, HDL, CHOLHDL, VLDL, LDLCALC ? ?TARGET DATE: ?16th of the month  ? ?Assessment: ?Dylan Hurst presents today for their second Apretude loading injection and to follow up for HIV PrEP. Patient reports experiencing some pain and soreness for two weeks after th first injection. Counseled patient that they can use ibuprofen/acetaminophen or warm compress for pain. Patient reports no new sexual partners since last visit. Last STI screening was in 12/2021. Patient agreeable to STI testing today. Screened patient for acute HIV symptoms such  as fatigue, muscle aches, rash, sore throat, lymphadenopathy, headache, night sweats, nausea/vomiting/diarrhea, and fever. Rapid HIV blood test was drawn immediately prior to injection and was negative. Patient also had HIV RNA drawn today.  ? ?Administered cabotegravir '600mg'$ /39m in left upper outer quadrant of the gluteal muscle. Monitored patient for 10 minutes after injection. Injection was tolerated well without issue. Will see him back in 2 months for injection, labs, and HIV PrEP follow up. ? ?Plan: ?- Apretude injection administered ?- HIV RNA, RPR, urine/rectal/oral for cytology today  ?- Next injection, labs, and PrEP follow up appointment scheduled for 06/05/22 with Cassie ?- Call with any issues or questions ? ?VMarlowe Alt PharmD Candidate ?04/05/2022 5:15 PM ? ?

## 2022-04-06 LAB — CYTOLOGY, (ORAL, ANAL, URETHRAL) ANCILLARY ONLY
Chlamydia: NEGATIVE
Comment: NEGATIVE
Comment: NORMAL
Neisseria Gonorrhea: NEGATIVE

## 2022-04-06 LAB — URINE CYTOLOGY ANCILLARY ONLY
Chlamydia: NEGATIVE
Comment: NEGATIVE
Comment: NORMAL
Neisseria Gonorrhea: NEGATIVE

## 2022-04-07 LAB — RPR: RPR Ser Ql: NONREACTIVE

## 2022-04-07 LAB — HIV-1 RNA QUANT-NO REFLEX-BLD
HIV 1 RNA Quant: NOT DETECTED copies/mL
HIV-1 RNA Quant, Log: NOT DETECTED Log copies/mL

## 2022-04-09 LAB — CYTOLOGY, (ORAL, ANAL, URETHRAL) ANCILLARY ONLY
Chlamydia: NEGATIVE
Comment: NEGATIVE
Comment: NORMAL
Neisseria Gonorrhea: NEGATIVE

## 2022-04-20 ENCOUNTER — Other Ambulatory Visit (HOSPITAL_COMMUNITY): Payer: Self-pay

## 2022-04-25 ENCOUNTER — Other Ambulatory Visit: Payer: Self-pay

## 2022-04-25 MED ORDER — NEOMYCIN-POLYMYXIN-DEXAMETH 3.5-10000-0.1 OP OINT
TOPICAL_OINTMENT | OPHTHALMIC | 0 refills | Status: AC
Start: 2022-04-25 — End: ?
  Filled 2022-04-25: qty 3.5, 20d supply, fill #0

## 2022-04-26 ENCOUNTER — Ambulatory Visit (INDEPENDENT_AMBULATORY_CARE_PROVIDER_SITE_OTHER): Payer: Self-pay | Admitting: Pharmacist

## 2022-04-26 ENCOUNTER — Encounter: Payer: Self-pay | Admitting: Pharmacist

## 2022-04-26 ENCOUNTER — Other Ambulatory Visit: Payer: Self-pay

## 2022-04-26 DIAGNOSIS — Z113 Encounter for screening for infections with a predominantly sexual mode of transmission: Secondary | ICD-10-CM

## 2022-04-26 NOTE — Progress Notes (Signed)
? ?  04/26/2022 ? ?HPI: Dylan Hurst is a 36 y.o. male who presents to the [place] clinic today for STI testing. ? ?Patient Active Problem List  ? Diagnosis Date Noted  ? Generalized anxiety disorder 07/25/2021  ? History of substance use 07/25/2021  ? On pre-exposure prophylaxis for HIV 03/14/2021  ? Screening for STDs (sexually transmitted diseases) 03/14/2021  ? Need for hepatitis C screening test 03/14/2021  ? ? ?Patient's Medications  ?New Prescriptions  ? No medications on file  ?Previous Medications  ? CABOTEGRAVIR ER (APRETUDE) 600 MG/3ML INJECTION    Inject 3 mLs (600 mg total) into the muscle every 2 (two) months.  ? NEOMYCIN-POLYMYXIN B-DEXAMETHASONE (MAXITROL) 3.5-10000-0.1 OINT    APPLY A SMALL AMOUNT ONTO SUTURES OR OPERATIVE SITE TWICE A DAY FOR THREE DAYS THEN STOP  ?Modified Medications  ? No medications on file  ?Discontinued Medications  ? No medications on file  ? ? ?Allergies: ?No Known Allergies ? ?Past Medical History: ?Past Medical History:  ?Diagnosis Date  ? Anxiety   ? Depression   ? Known health problems: none   ? ? ?Social History: ?Social History  ? ?Socioeconomic History  ? Marital status: Single  ?  Spouse name: Not on file  ? Number of children: Not on file  ? Years of education: Not on file  ? Highest education level: Not on file  ?Occupational History  ? Not on file  ?Tobacco Use  ? Smoking status: Light Smoker  ?  Types: Cigarettes  ? Smokeless tobacco: Never  ?Vaping Use  ? Vaping Use: Never used  ?Substance and Sexual Activity  ? Alcohol use: Not Currently  ? Drug use: Yes  ?  Types: Marijuana, IV  ? Sexual activity: Not on file  ?Other Topics Concern  ? Not on file  ?Social History Narrative  ? Not on file  ? ?Social Determinants of Health  ? ?Financial Resource Strain: Not on file  ?Food Insecurity: Not on file  ?Transportation Needs: Not on file  ?Physical Activity: Not on file  ?Stress: Not on file  ?Social Connections: Not on file  ? ? ?Assessment: ?[name] is here  today for STI screening. He states that he had a new partner last Friday and they engaged in unprotected sex. He has noticed a small bump on his rectum the last few days but states that it is going away right now. No rashes, ulcers, sore throat, fevers, nausea, or vomiting. No penile or rectal discharge, itching, or pain. Will screen today and treat if needed.  ? ?Of note, he is on Apretude for PrEP. ? ?Plan: ?- STI screening: HIV antibody, RPR, urine/rectal/pharyngeal GC/CT swabs for cytology today ?- F/u results to see if treatment needed ? ?Aloura Matsuoka L. Cosmo Tetreault, PharmD, BCIDP, AAHIVP, CPP ?Clinical Pharmacist Practitioner ?Infectious Diseases Clinical Pharmacist ?[place] [place] [facility] ?04/26/2022, 3:48 PM ? ?

## 2022-04-27 LAB — CYTOLOGY, (ORAL, ANAL, URETHRAL) ANCILLARY ONLY
Chlamydia: NEGATIVE
Chlamydia: NEGATIVE
Comment: NEGATIVE
Comment: NEGATIVE
Comment: NORMAL
Comment: NORMAL
Neisseria Gonorrhea: NEGATIVE
Neisseria Gonorrhea: NEGATIVE

## 2022-04-27 LAB — HIV ANTIBODY (ROUTINE TESTING W REFLEX): HIV 1&2 Ab, 4th Generation: NONREACTIVE

## 2022-04-27 LAB — URINE CYTOLOGY ANCILLARY ONLY
Chlamydia: NEGATIVE
Comment: NEGATIVE
Comment: NORMAL
Neisseria Gonorrhea: NEGATIVE

## 2022-04-27 LAB — RPR: RPR Ser Ql: NONREACTIVE

## 2022-04-30 ENCOUNTER — Other Ambulatory Visit: Payer: Self-pay

## 2022-04-30 MED ORDER — PREDNISONE 10 MG PO TABS
ORAL_TABLET | ORAL | 0 refills | Status: AC
  Filled 2022-04-30: qty 29, 15d supply, fill #0

## 2022-05-01 ENCOUNTER — Other Ambulatory Visit: Payer: Self-pay

## 2022-05-09 ENCOUNTER — Other Ambulatory Visit: Payer: Self-pay

## 2022-05-25 ENCOUNTER — Telehealth: Payer: Self-pay | Admitting: Pharmacist

## 2022-05-25 NOTE — Telephone Encounter (Signed)
Patient's specialty medication (Apretude) was delivered from [facility] and will be administered at next office visit on 6/13.  Alfonse Spruce, PharmD, CPP Clinical Pharmacist Practitioner Infect[facility] [place] [facility]

## 2022-06-05 ENCOUNTER — Other Ambulatory Visit: Payer: Self-pay

## 2022-06-05 ENCOUNTER — Other Ambulatory Visit: Payer: Self-pay | Admitting: Emergency Medicine

## 2022-06-05 ENCOUNTER — Other Ambulatory Visit: Payer: Self-pay | Admitting: Pharmacist

## 2022-06-05 ENCOUNTER — Ambulatory Visit (INDEPENDENT_AMBULATORY_CARE_PROVIDER_SITE_OTHER): Payer: Self-pay | Admitting: Pharmacist

## 2022-06-05 DIAGNOSIS — Z113 Encounter for screening for infections with a predominantly sexual mode of transmission: Secondary | ICD-10-CM

## 2022-06-05 DIAGNOSIS — Z7251 High risk heterosexual behavior: Secondary | ICD-10-CM

## 2022-06-05 DIAGNOSIS — Z79899 Other long term (current) drug therapy: Secondary | ICD-10-CM

## 2022-06-05 MED ORDER — CABOTEGRAVIR ER 600 MG/3ML IM SUER
600.0000 mg | Freq: Once | INTRAMUSCULAR | Status: AC
  Administered 2022-06-05: 600 mg via INTRAMUSCULAR

## 2022-06-05 NOTE — Progress Notes (Unsigned)
HPI: Dylan Hurst is a 36 y.o. male who presents to the [place] clinic for Apretude administration and HIV PrEP follow up.  Patient Active Problem List   Diagnosis Date Noted   Generalized anxiety disorder 07/25/2021   History of substance use 07/25/2021   On pre-exposure prophylaxis for HIV 03/14/2021   Screening for STDs (sexually transmitted diseases) 03/14/2021   Need for hepatitis C screening test 03/14/2021    Patient's Medications  New Prescriptions   No medications on file  Previous Medications   CABOTEGRAVIR ER (APRETUDE) 600 MG/3ML INJECTION    Inject 3 mLs (600 mg total) into the muscle every 2 (two) months.   NEOMYCIN-POLYMYXIN B-DEXAMETHASONE (MAXITROL) 3.5-10000-0.1 OINT    APPLY A SMALL AMOUNT ONTO SUTURES OR OPERATIVE SITE TWICE A DAY FOR THREE DAYS THEN STOP   PREDNISONE (DELTASONE) 10 MG TABLET    Start day after surgery - 6 tablets for 2 days, then 4 tablets for 2 days, then 2 tablets for 2 days, then 1 tablet for 3 days then 0.5 tablets for 3 days  Modified Medications   No medications on file  Discontinued Medications   No medications on file    Allergies: No Known Allergies  Past Medical History: Past Medical History:  Diagnosis Date   Anxiety    Depression    Known health problems: none     Social History: Social History   Socioeconomic History   Marital status: Single    Spouse name: Not on file   Number of children: Not on file   Years of education: Not on file   Highest education level: Not on file  Occupational History   Not on file  Tobacco Use   Smoking status: Light Smoker    Types: Cigarettes   Smokeless tobacco: Never  Vaping Use   Vaping Use: Never used  Substance and Sexual Activity   Alcohol use: Not Currently   Drug use: Yes    Types: Marijuana, IV   Sexual activity: Not on file  Other Topics Concern   Not on file  Social History Narrative   Not on file   Social Determinants of Health   Financial  Resource Strain: Not on file  Food Insecurity: Not on file  Transportation Needs: Not on file  Physical Activity: Not on file  Stress: Not on file  Social Connections: Not on file    Labs: Lab Results  Component Value Date   HIV1RNAQUANT NOT DETECTED 04/05/2022   HIV1RNAQUANT Not Detected 03/08/2022   HIV1RNAQUANT Not Detected 01/04/2022    RPR and STI Lab Results  Component Value Date   LABRPR NON-REACTIVE 04/26/2022   LABRPR NON-REACTIVE 04/05/2022   LABRPR REACTIVE (A) 03/08/2022   LABRPR REACTIVE (A) 12/27/2021   LABRPR REACTIVE (A) 10/26/2021   RPRTITER 1:1 (H) 03/08/2022   RPRTITER 1:2 (H) 12/27/2021   RPRTITER 1:16 (H) 10/26/2021   RPRTITER 1:64 (H) 07/28/2021    STI Results GC CT  04/26/2022  3:50 PM Negative    Negative    Negative  Negative    Negative    Negative   04/05/2022  3:14 PM Negative    Negative    Negative  Negative    Negative    Negative   12/27/2021  3:05 PM Negative    Negative    Negative  Negative    Negative    Negative   10/26/2021 12:17 PM Negative    Positive    Positive  Negative  Negative    Negative   07/28/2021 11:25 AM Negative    Negative  Negative    Negative   06/21/2021  3:09 PM Negative    Negative    Negative  Negative    Negative    Negative   03/14/2021 10:09 AM Negative    Negative    Negative  Negative    Negative    Negative   05/26/2020  2:52 PM Negative    Negative    Negative  Negative    Negative    Negative   11/10/2019  1:52 PM Negative    Negative  Negative    Negative   03/17/2019 12:00 AM Negative    Negative    Negative  Negative    Negative    Negative     Hepatitis B Lab Results  Component Value Date   HEPBSAB REACTIVE (A) 03/17/2019   HEPBSAG NON-REACTIVE 03/17/2019   Hepatitis C Lab Results  Component Value Date   HEPCAB NON-REACTIVE 03/14/2021   Hepatitis A No results found for: "HAV" Lipids: No results found for: "CHOL", "TRIG", "HDL", "CHOLHDL", "VLDL",  "[place]"  TARGET DATE: The 16th of the month  Current PrEP Regimen: Apretude  Assessment: Dylan Hurst presents today for their Apretude injection and to follow up for HIV PrEP. No issues with past injections. No known exposures to any STIs and no signs or symptoms of any STIs today. Last STI screening was 04/26/22 and was negative. He would like urine/oral/rectal cytologies today. Screened patient for acute HIV symptoms such as fatigue, muscle aches, rash, sore throat, lymphadenopathy, headache, night sweats, nausea/vomiting/diarrhea, and fever. Patient denies any symptoms. No new partners since last injection. Rapid HIV blood test was drawn immediately prior to injection and was negative. Patient also had HIV RNA drawn today.   Administered cabotegravir '600mg'$ /88m in right upper outer quadrant of the gluteal muscle. Will make follow up appointments for maintenance injections every 2 months.    Plan: - Check HIV RNA, RPR, and urine/oral/rectal cytology - Maintenance injections scheduled for 08/06/22 @ 1:45 with Cassie - Call with any issues or questions  ALestine Box PharmD PGY2 Infectious Diseases Pharmacy Resident   Please check [email or web address] for unit-specific pharmacy phone numbers

## 2022-06-07 ENCOUNTER — Ambulatory Visit: Payer: No Typology Code available for payment source | Admitting: Pharmacist

## 2022-06-07 LAB — CYTOLOGY, (ORAL, ANAL, URETHRAL) ANCILLARY ONLY
Chlamydia: NEGATIVE
Comment: NEGATIVE
Comment: NORMAL
Neisseria Gonorrhea: NEGATIVE

## 2022-06-07 LAB — URINE CYTOLOGY ANCILLARY ONLY
Chlamydia: NEGATIVE
Comment: NEGATIVE
Comment: NORMAL
Neisseria Gonorrhea: NEGATIVE

## 2022-06-08 LAB — HIV-1 RNA QUANT-NO REFLEX-BLD
HIV 1 RNA Quant: NOT DETECTED Copies/mL
HIV-1 RNA Quant, Log: NOT DETECTED Log cps/mL

## 2022-06-08 LAB — RPR: RPR Ser Ql: NONREACTIVE

## 2022-06-14 IMAGING — CT CT ORBITS W/ CM
3 series · 14 of 47 positions shown, 16 images · IV contrast (agent unspecified)
Comparison: None.

CLINICAL DATA: 35-year-old male with right eyelid swelling. Reports
remote facial trauma 7-8 years ago.

EXAM:
CT ORBITS WITH CONTRAST
TECHNIQUE: Multidetector CT images was performed according to the standard
protocol following intravenous contrast administration.

[Series 2: 1 orbits soft · axial · 0.35mm/px · z∈[+1063,+1145]mm · 8 of 49 slices shown, 10 images]
[im 4/49  brain]
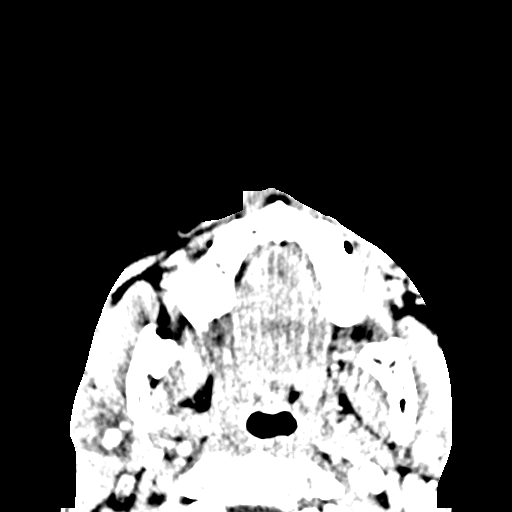
[im 4/49  bone]
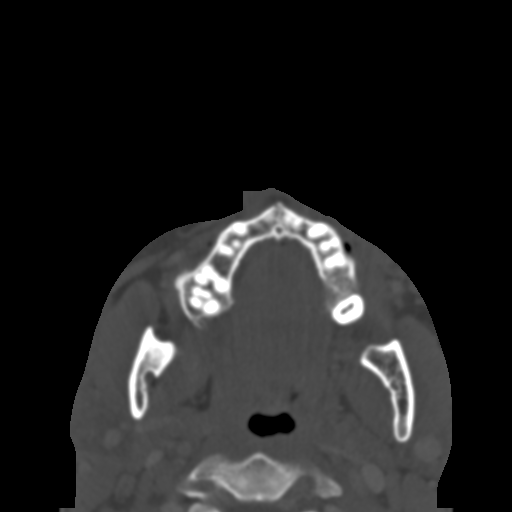
[im 10/49  bone]
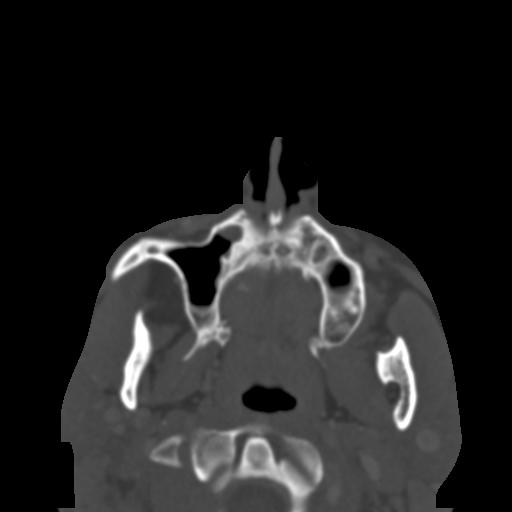
[im 15/49  bone]
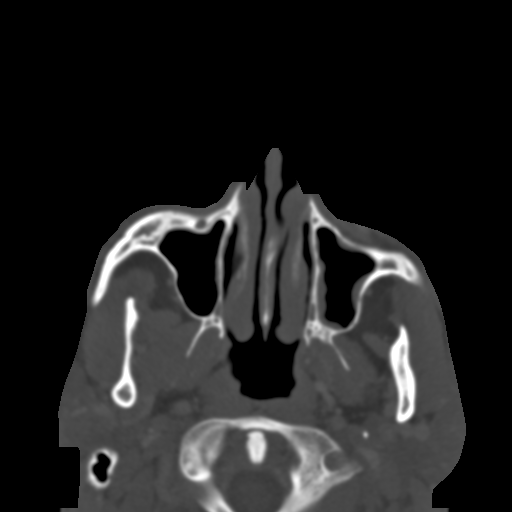
[im 22/49  bone]
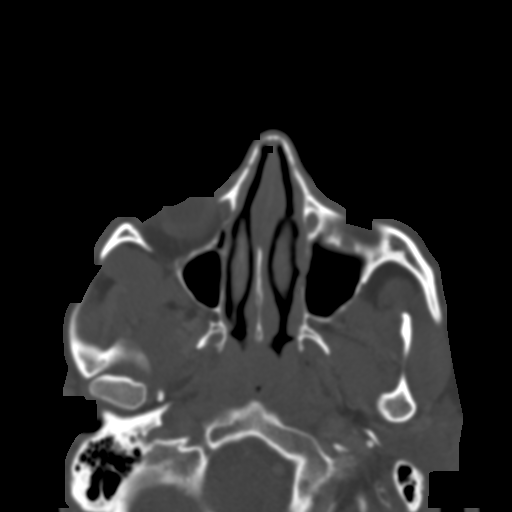
[im 27/49  brain]
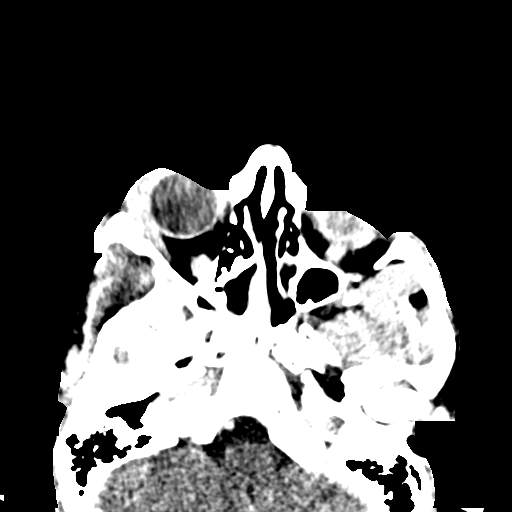
[im 27/49  bone]
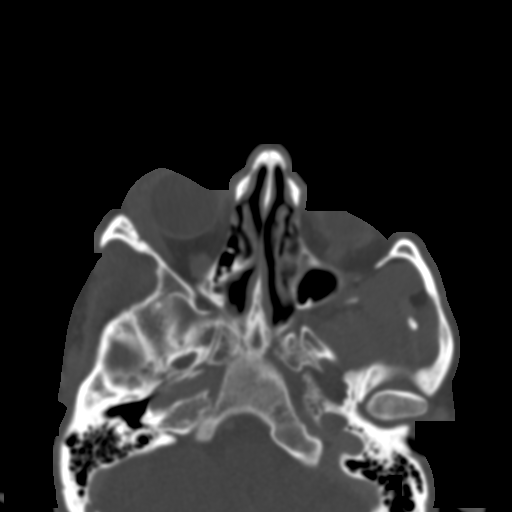
[im 34/49  bone]
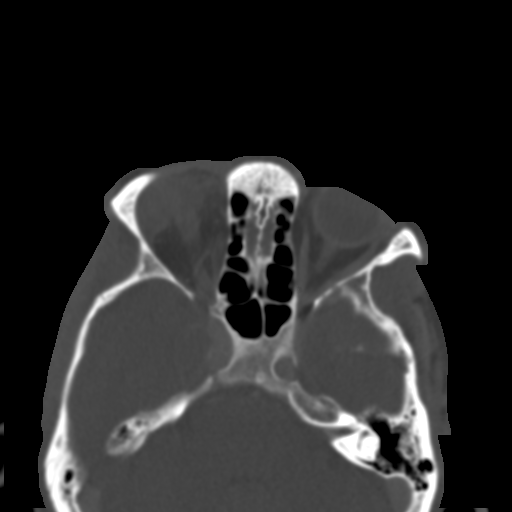
[im 39/49  bone]
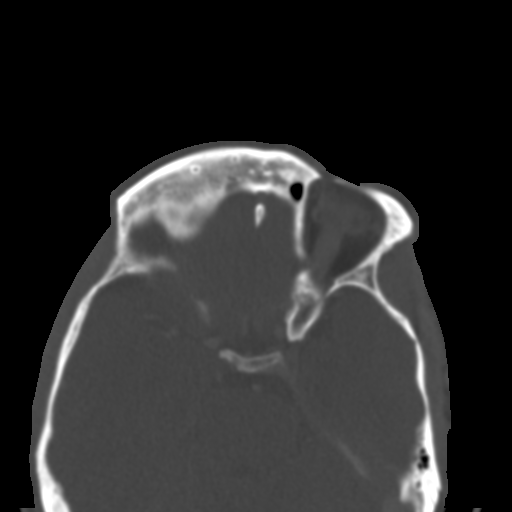
[im 45/49  bone]
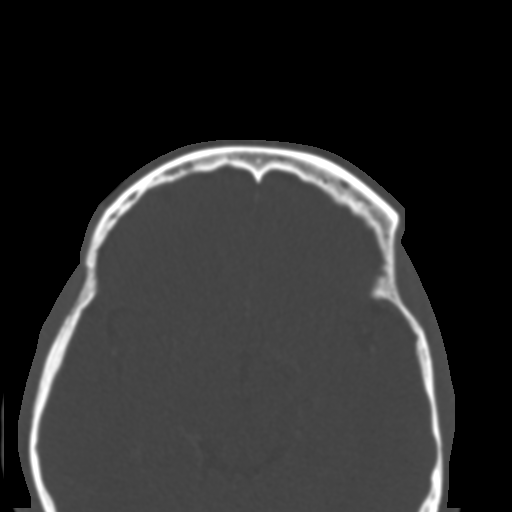

[Series 4: coronal soft · coronal · 0.25mm/px · 3 of 76 slices shown]
[im 26/76  bone]
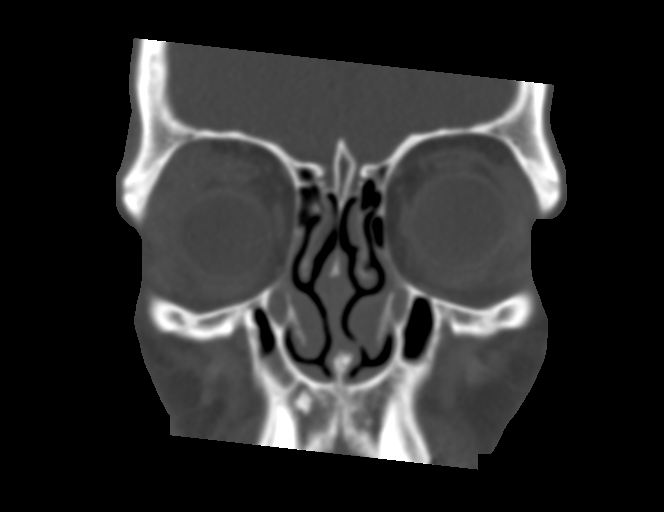
[im 34/76  bone]
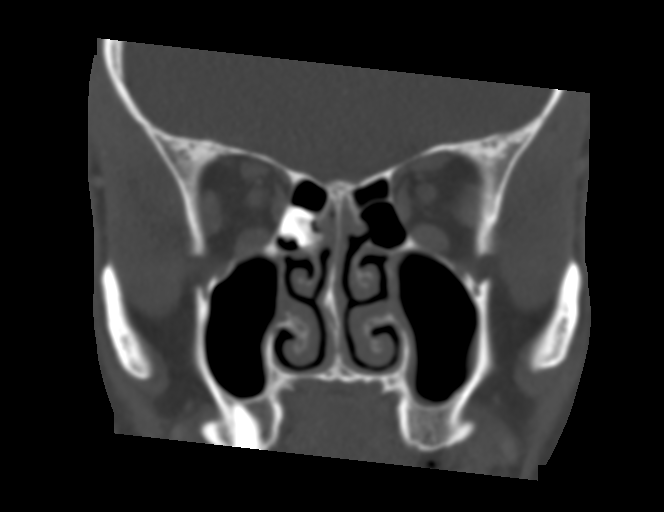
[im 42/76  bone]
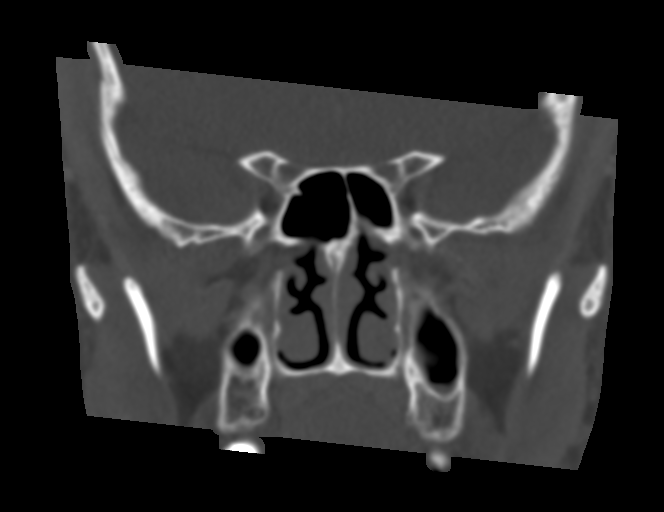

[Series 6: sagittal soft · sagittal · 0.25mm/px · 3 of 84 slices shown]
[im 28/84  bone]
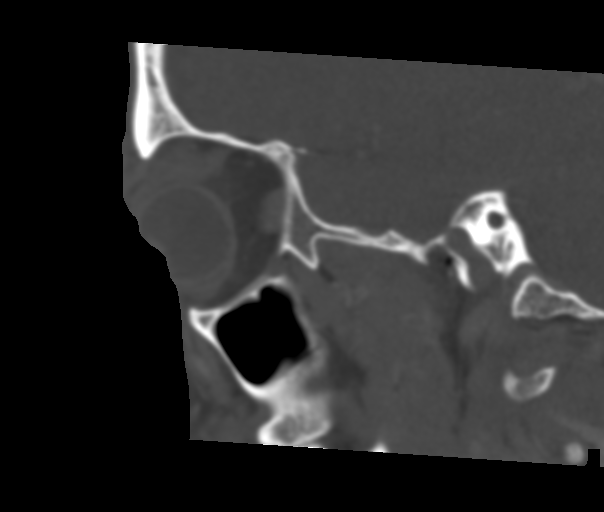
[im 42/84  bone]
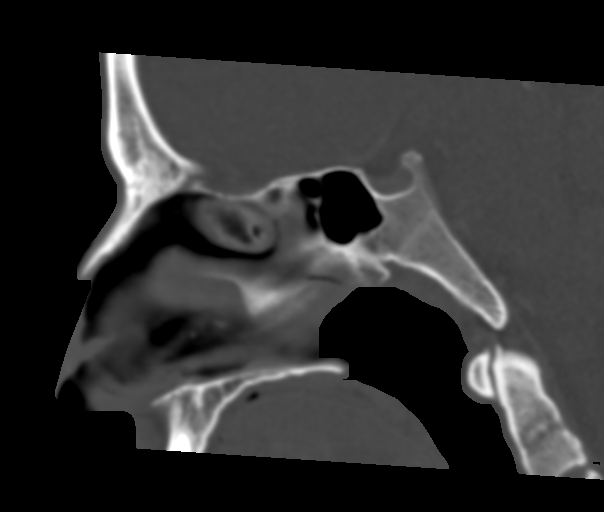
[im 56/84  bone]
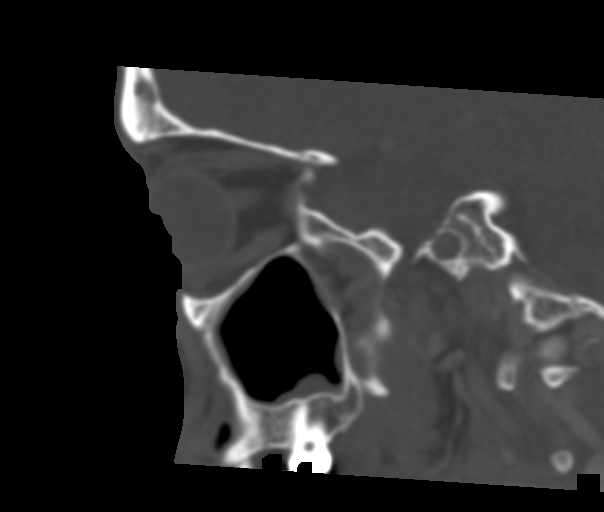

[14 of 47 positions shown; findings below may reference images not displayed]

RADIATION DOSE REDUCTION: This exam was performed according to the
departmental dose-optimization program which includes automated
exposure control, adjustment of the mA and/or kV according to
patient size and/or use of iterative reconstruction technique.

CONTRAST:  75mL OMNIPAQUE IOHEXOL 300 MG/ML  SOLN
FINDINGS: Orbits: Intact orbital walls. Left globe and intraorbital soft
tissues are normal.

Intermediate density, fairly homogeneous and nonspecific appearing
soft tissue mass of the right orbits spans from the postseptal
extraconal space superiorly and laterally (series 4, image 50 and
series 2, image 32) through the septum and into the superficial
periorbital soft tissues which are smoothly enlarged, swollen. There
is a sharp margin with the postseptal fat on series 2, image 33.
Lacrimal gland is obscured. In the postseptal space the mass is also
inseparable from the lateral rectus muscle fibers, and to a lesser
extent the superior rectus. Right globe is affected along the broad
margin but appears symmetric and intact. All told, the mass-like
area encompasses 34 x 23 x 27 mm (AP by transverse by CC). No gas or
fluid collection.

Visible paranasal sinuses: Lobulated but inconsequential appearing
osteoma of the posterior right ethmoid air cells is 10 mm (series 3,
image 29 series 5, image 43. Minor superimposed bilateral paranasal
sinus mucosal thickening. Tympanic cavities and mastoids are clear.

Soft tissues: Negative visible pharynx, parapharyngeal spaces,
masticator and parotid spaces. Visualized scalp soft tissues are
within normal limits.

Osseous: No osseous abnormality identified.

Limited intracranial: Negative. Cavernous sinuses enhance
symmetrically and appear normal. Dominant appearing left vertebral
artery.
IMPRESSION: 1. Intermediate density right orbital soft tissue mass measures up
to 3.4 cm and involves both preseptal and postseptal spaces, could
be arising from the lacrimal gland. No additional intraorbital
inflammation. No bone erosion.
Top differential considerations include benign and malignant
neoplasm (lacrimal gland adenoma, adenoidcystic or adenocarcinoma,
orbital Lymphoma) and inflammatory etiologies (sarcoidosis,
pseudotumor).

2. Normal left orbit and otherwise negative; incidental right
ethmoid osteoma.

## 2022-07-17 ENCOUNTER — Telehealth: Payer: Self-pay

## 2022-07-17 NOTE — Telephone Encounter (Signed)
[facility] Patient Advocate Encounter  Patient's medication (Apretude) have been couriered to [facility] from [facility] and will be administered on the patient next office visit on 08/06/22.  Ileene Patrick , [facility] Patient [facility] for Infectious Disease Phone: [phone] Fax:  [phone]

## 2022-08-06 ENCOUNTER — Other Ambulatory Visit: Payer: Self-pay

## 2022-08-06 ENCOUNTER — Ambulatory Visit (INDEPENDENT_AMBULATORY_CARE_PROVIDER_SITE_OTHER): Payer: Self-pay | Admitting: Pharmacist

## 2022-08-06 DIAGNOSIS — Z113 Encounter for screening for infections with a predominantly sexual mode of transmission: Secondary | ICD-10-CM

## 2022-08-06 DIAGNOSIS — Z79899 Other long term (current) drug therapy: Secondary | ICD-10-CM

## 2022-08-06 MED ORDER — CABOTEGRAVIR ER 600 MG/3ML IM SUER
600.0000 mg | Freq: Once | INTRAMUSCULAR | Status: AC
  Administered 2022-08-06: 600 mg via INTRAMUSCULAR

## 2022-08-06 NOTE — Progress Notes (Signed)
HPI: Dylan Hurst is a 36 y.o. male who presents to the [place] clinic for Apretude administration and HIV PrEP follow up.  Patient Active Problem List   Diagnosis Date Noted   Generalized anxiety disorder 07/25/2021   History of substance use 07/25/2021   On pre-exposure prophylaxis for HIV 03/14/2021   Screening for STDs (sexually transmitted diseases) 03/14/2021   Need for hepatitis C screening test 03/14/2021    Patient's Medications  New Prescriptions   No medications on file  Previous Medications   CABOTEGRAVIR ER (APRETUDE) 600 MG/3ML INJECTION    Inject 3 mLs (600 mg total) into the muscle every 2 (two) months.   NEOMYCIN-POLYMYXIN B-DEXAMETHASONE (MAXITROL) 3.5-10000-0.1 OINT    APPLY A SMALL AMOUNT ONTO SUTURES OR OPERATIVE SITE TWICE A DAY FOR THREE DAYS THEN STOP   PREDNISONE (DELTASONE) 10 MG TABLET    Start day after surgery - 6 tablets for 2 days, then 4 tablets for 2 days, then 2 tablets for 2 days, then 1 tablet for 3 days then 0.5 tablets for 3 days  Modified Medications   No medications on file  Discontinued Medications   No medications on file    Allergies: No Known Allergies  Past Medical History: Past Medical History:  Diagnosis Date   Anxiety    Depression    Known health problems: none     Social History: Social History   Socioeconomic History   Marital status: Single    Spouse name: Not on file   Number of children: Not on file   Years of education: Not on file   Highest education level: Not on file  Occupational History   Not on file  Tobacco Use   Smoking status: Light Smoker    Types: Cigarettes   Smokeless tobacco: Never  Vaping Use   Vaping Use: Never used  Substance and Sexual Activity   Alcohol use: Not Currently   Drug use: Yes    Types: Marijuana, IV   Sexual activity: Not on file  Other Topics Concern   Not on file  Social History Narrative   Not on file   Social Determinants of Health   Financial  Resource Strain: Not on file  Food Insecurity: Not on file  Transportation Needs: Not on file  Physical Activity: Not on file  Stress: Not on file  Social Connections: Not on file    Labs: Lab Results  Component Value Date   HIV1RNAQUANT Not Detected 06/05/2022   HIV1RNAQUANT NOT DETECTED 04/05/2022   HIV1RNAQUANT Not Detected 03/08/2022    RPR and STI Lab Results  Component Value Date   LABRPR NON-REACTIVE 06/05/2022   LABRPR NON-REACTIVE 04/26/2022   LABRPR NON-REACTIVE 04/05/2022   LABRPR REACTIVE (A) 03/08/2022   LABRPR REACTIVE (A) 12/27/2021   RPRTITER 1:1 (H) 03/08/2022   RPRTITER 1:2 (H) 12/27/2021   RPRTITER 1:16 (H) 10/26/2021   RPRTITER 1:64 (H) 07/28/2021    STI Results GC CT  06/05/2022  4:26 PM Negative  Negative   06/05/2022  4:17 PM Negative  Negative   04/26/2022  3:50 PM Negative    Negative    Negative  Negative    Negative    Negative   04/05/2022  3:14 PM Negative    Negative    Negative  Negative    Negative    Negative   12/27/2021  3:05 PM Negative    Negative    Negative  Negative    Negative  Negative   10/26/2021 12:17 PM Negative    Positive    Positive  Negative    Negative    Negative   07/28/2021 11:25 AM Negative    Negative  Negative    Negative   06/21/2021  3:09 PM Negative    Negative    Negative  Negative    Negative    Negative   03/14/2021 10:09 AM Negative    Negative    Negative  Negative    Negative    Negative   05/26/2020  2:52 PM Negative    Negative    Negative  Negative    Negative    Negative   11/10/2019  1:52 PM Negative    Negative  Negative    Negative   03/17/2019 12:00 AM Negative    Negative    Negative  Negative    Negative    Negative     Hepatitis B Lab Results  Component Value Date   HEPBSAB REACTIVE (A) 03/17/2019   HEPBSAG NON-REACTIVE 03/17/2019   Hepatitis C Lab Results  Component Value Date   HEPCAB NON-REACTIVE 03/14/2021   Hepatitis A No results found for:  "HAV" Lipids: No results found for: "CHOL", "TRIG", "HDL", "CHOLHDL", "VLDL", "[place]"  TARGET DATE: The 16th  Assessment: Dylan Hurst presents today for his Apretude injection and to follow up for HIV PrEP. No issues with past injections. No known exposures to any STIs and no signs or symptoms of any STIs today. Last STI screening was 06/05/22 and was negative. Screened for acute HIV symptoms such as fatigue, muscle aches, rash, sore throat, lymphadenopathy, headache, night sweats, nausea/vomiting/diarrhea, and fever. Denies any symptoms. No new partners since last injection but did have unprotected sex last Friday so wishes to have STI testing today. Rapid HIV blood test was drawn immediately prior to injection and was negative. HIV RNA collected today as well.  Administered cabotegravir '600mg'$ /69m in right upper outer quadrant of the gluteal muscle. Injection was tolerated well without issue. Will see him back in 2 months for injection, labs, and HIV PrEP follow up.  Plan: - Apretude injection administered - HIV RNA, RPR, urine/rectal/pharyngeal cytologies for GC/chlamydia today - Next injection, labs, and PrEP follow up appointment scheduled for 10/02/22  - Call with any issues or questions  Dylan Hurst, PharmD, BCIDP, AAHIVP, CTompkinsClinical Pharmacist Practitioner [place] [facility]

## 2022-08-07 LAB — URINE CYTOLOGY ANCILLARY ONLY
Chlamydia: NEGATIVE
Comment: NEGATIVE
Comment: NORMAL
Neisseria Gonorrhea: NEGATIVE

## 2022-08-07 LAB — CYTOLOGY, (ORAL, ANAL, URETHRAL) ANCILLARY ONLY
Chlamydia: NEGATIVE
Chlamydia: NEGATIVE
Comment: NEGATIVE
Comment: NEGATIVE
Comment: NORMAL
Comment: NORMAL
Neisseria Gonorrhea: NEGATIVE
Neisseria Gonorrhea: NEGATIVE

## 2022-08-09 LAB — HIV-1 RNA QUANT-NO REFLEX-BLD
HIV 1 RNA Quant: NOT DETECTED Copies/mL
HIV-1 RNA Quant, Log: NOT DETECTED Log cps/mL

## 2022-08-09 LAB — RPR: RPR Ser Ql: NONREACTIVE

## 2022-09-26 ENCOUNTER — Telehealth: Payer: Self-pay

## 2022-09-26 NOTE — Telephone Encounter (Signed)
[facility] Patient Advocate Encounter  Patient's medications (Apretude) have been couriered to [facility] from [facility]: [phone] , and will be administered on 10/02/2022.

## 2022-10-02 ENCOUNTER — Other Ambulatory Visit: Payer: No Typology Code available for payment source

## 2022-10-02 ENCOUNTER — Other Ambulatory Visit: Payer: Self-pay

## 2022-10-02 ENCOUNTER — Ambulatory Visit (INDEPENDENT_AMBULATORY_CARE_PROVIDER_SITE_OTHER): Payer: Self-pay | Admitting: Pharmacist

## 2022-10-02 DIAGNOSIS — Z113 Encounter for screening for infections with a predominantly sexual mode of transmission: Secondary | ICD-10-CM

## 2022-10-02 DIAGNOSIS — Z79899 Other long term (current) drug therapy: Secondary | ICD-10-CM

## 2022-10-02 MED ORDER — CABOTEGRAVIR ER 600 MG/3ML IM SUER
600.0000 mg | Freq: Once | INTRAMUSCULAR | Status: AC
  Administered 2022-10-02: 600 mg via INTRAMUSCULAR

## 2022-10-02 NOTE — Progress Notes (Signed)
HPI: Dylan Hurst is a 36 y.o. male who presents to the [facility] for Apretude administration and HIV PrEP follow up.  Patient Active Problem List   Diagnosis Date Noted   Generalized anxiety disorder 07/25/2021   History of substance use 07/25/2021   On pre-exposure prophylaxis for HIV 03/14/2021   Screening for STDs (sexually transmitted diseases) 03/14/2021   Need for hepatitis C screening test 03/14/2021    Patient's Medications  New Prescriptions   No medications on file  Previous Medications   CABOTEGRAVIR ER (APRETUDE) 600 MG/3ML INJECTION    Inject 3 mLs (600 mg total) into the muscle every 2 (two) months.   NEOMYCIN-POLYMYXIN B-DEXAMETHASONE (MAXITROL) 3.5-10000-0.1 OINT    APPLY A SMALL AMOUNT ONTO SUTURES OR OPERATIVE SITE TWICE A DAY FOR THREE DAYS THEN STOP   PREDNISONE (DELTASONE) 10 MG TABLET    Start day after surgery - 6 tablets for 2 days, then 4 tablets for 2 days, then 2 tablets for 2 days, then 1 tablet for 3 days then 0.5 tablets for 3 days  Modified Medications   No medications on file  Discontinued Medications   No medications on file    Allergies: No Known Allergies  Past Medical History: Past Medical History:  Diagnosis Date   Anxiety    Depression    Known health problems: none     Social History: Social History   Socioeconomic History   Marital status: Single    Spouse name: Not on file   Number of children: Not on file   Years of education: Not on file   Highest education level: Not on file  Occupational History   Not on file  Tobacco Use   Smoking status: Light Smoker    Types: Cigarettes   Smokeless tobacco: Never  Vaping Use   Vaping Use: Never used  Substance and Sexual Activity   Alcohol use: Not Currently   Drug use: Yes    Types: Marijuana, IV   Sexual activity: Not on file  Other Topics Concern   Not on file  Social History Narrative   Not on file   Social Determinants of Health   Financial  Resource Strain: Not on file  Food Insecurity: Not on file  Transportation Needs: Not on file  Physical Activity: Not on file  Stress: Not on file  Social Connections: Not on file    Labs: Lab Results  Component Value Date   HIV1RNAQUANT Not Detected 08/06/2022   HIV1RNAQUANT Not Detected 06/05/2022   HIV1RNAQUANT NOT DETECTED 04/05/2022    RPR and STI Lab Results  Component Value Date   LABRPR NON-REACTIVE 08/06/2022   LABRPR NON-REACTIVE 06/05/2022   LABRPR NON-REACTIVE 04/26/2022   LABRPR NON-REACTIVE 04/05/2022   LABRPR REACTIVE (A) 03/08/2022   RPRTITER 1:1 (H) 03/08/2022   RPRTITER 1:2 (H) 12/27/2021   RPRTITER 1:16 (H) 10/26/2021   RPRTITER 1:64 (H) 07/28/2021    STI Results GC CT  08/06/2022  2:09 PM Negative    Negative  Negative    Negative   08/06/2022  1:53 PM Negative  Negative   06/05/2022  4:26 PM Negative  Negative   06/05/2022  4:17 PM Negative  Negative   04/26/2022  3:50 PM Negative    Negative    Negative  Negative    Negative    Negative   04/05/2022  3:14 PM Negative    Negative    Negative  Negative    Negative  Negative   12/27/2021  3:05 PM Negative    Negative    Negative  Negative    Negative    Negative   10/26/2021 12:17 PM Negative    Positive    Positive  Negative    Negative    Negative   07/28/2021 11:25 AM Negative    Negative  Negative    Negative   06/21/2021  3:09 PM Negative    Negative    Negative  Negative    Negative    Negative   03/14/2021 10:09 AM Negative    Negative    Negative  Negative    Negative    Negative   05/26/2020  2:52 PM Negative    Negative    Negative  Negative    Negative    Negative   11/10/2019  1:52 PM Negative    Negative  Negative    Negative   03/17/2019 12:00 AM Negative    Negative    Negative  Negative    Negative    Negative     Hepatitis B Lab Results  Component Value Date   HEPBSAB REACTIVE (A) 03/17/2019   HEPBSAG NON-REACTIVE 03/17/2019   Hepatitis  C Lab Results  Component Value Date   HEPCAB NON-REACTIVE 03/14/2021   Hepatitis A No results found for: "HAV" Lipids: No results found for: "CHOL", "TRIG", "HDL", "CHOLHDL", "VLDL", "[place]"  TARGET DATE: 16th  Assessment: Daishon presents today for his Apretude injection and to follow up for HIV PrEP. Sometimes he has experienced injection site pain about a week or two after injections. He has tried ibuprofen and tylenol in the past which has helped. Also discussed that warm compresses may help. 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 He is still interested in continuing with Apretude as he has experienced issues with forgetting to take medications in the past and wants to ensure he is protected against HIV. Screened for acute HIV symptoms such as fatigue, muscle aches, rash, sore throat, lymphadenopathy, headache, night sweats, nausea/vomiting/diarrhea, and fever. Denies any symptoms. No known exposures to any STIs and no signs or symptoms of any STIs today. He politely declines annual flu vaccine today. He is primarily receptive. He is interested in STI screening.  Per [employer] guidelines, a rapid HIV test should be drawn prior to Apretude administration. Due to state shortage of rapid HIV tests, this is temporarily unable to be done. Per decision from [facility], we will proceed with Apretude administration at this time without a negative rapid HIV test beforehand. HIV RNA was collected today and is in process.  Administered cabotegravir '600mg'$ /52m in right upper outer quadrant of the gluteal muscle. Injection was tolerated well without issue. Will see him back in 2 months for injection, labs, and HIV PrEP follow up.  Plan: - Apretude injection administered - HIV RNA today - Check oral and rectal cytologies, RPR  - Next injection, labs, and PrEP follow up appointment scheduled for 12/04/22 - Call with any issues or questions  CEliseo Gum PharmD PGY1 Pharmacy Resident   10/02/2022  2:18 PM

## 2022-10-03 LAB — CYTOLOGY, (ORAL, ANAL, URETHRAL) ANCILLARY ONLY
Chlamydia: NEGATIVE
Chlamydia: NEGATIVE
Comment: NEGATIVE
Comment: NEGATIVE
Comment: NORMAL
Comment: NORMAL
Neisseria Gonorrhea: NEGATIVE
Neisseria Gonorrhea: NEGATIVE

## 2022-10-04 LAB — HIV-1 RNA QUANT-NO REFLEX-BLD
HIV 1 RNA Quant: NOT DETECTED Copies/mL
HIV-1 RNA Quant, Log: NOT DETECTED Log cps/mL

## 2022-10-04 LAB — RPR: RPR Ser Ql: NONREACTIVE

## 2022-11-28 ENCOUNTER — Telehealth: Payer: Self-pay

## 2022-11-28 NOTE — Telephone Encounter (Signed)
[facility] Patient Advocate Encounter  Patient's medication (Apretude) have been couriered to [facility] from [employer] and will be administered on the patient next office visit on 12/04/22.  Ileene Patrick , [place] Patient [facility] for Infectious Disease Phone: [phone] Fax:  [phone]

## 2022-12-04 ENCOUNTER — Ambulatory Visit: Payer: Self-pay | Admitting: Pharmacist

## 2022-12-06 ENCOUNTER — Encounter: Payer: Self-pay | Admitting: Pharmacist

## 2022-12-11 ENCOUNTER — Other Ambulatory Visit: Payer: Self-pay

## 2022-12-11 ENCOUNTER — Other Ambulatory Visit (HOSPITAL_COMMUNITY)
Admission: RE | Admit: 2022-12-11 | Discharge: 2022-12-11 | Disposition: A | Discharge status: Home / Self Care | Payer: No Typology Code available for payment source | Source: Ambulatory Visit | Attending: Infectious Disease | Admitting: Infectious Disease

## 2022-12-11 ENCOUNTER — Ambulatory Visit (INDEPENDENT_AMBULATORY_CARE_PROVIDER_SITE_OTHER): Payer: Self-pay | Admitting: Pharmacist

## 2022-12-11 DIAGNOSIS — Z2981 Encounter for HIV pre-exposure prophylaxis: Secondary | ICD-10-CM

## 2022-12-11 DIAGNOSIS — Z79899 Other long term (current) drug therapy: Secondary | ICD-10-CM

## 2022-12-11 DIAGNOSIS — Z113 Encounter for screening for infections with a predominantly sexual mode of transmission: Secondary | ICD-10-CM | POA: Insufficient documentation

## 2022-12-11 MED ORDER — CABOTEGRAVIR ER 600 MG/3ML IM SUER
600.0000 mg | Freq: Once | INTRAMUSCULAR | Status: AC
  Administered 2022-12-11: 600 mg via INTRAMUSCULAR

## 2022-12-11 NOTE — Progress Notes (Unsigned)
HPI: Dylan Hurst is a 36 y.o. male who presents to the [facility] clinic for Apretude administration and HIV PrEP follow up.  Patient Active Problem List   Diagnosis Date Noted   Generalized anxiety disorder 07/25/2021   History of substance use 07/25/2021   On pre-exposure prophylaxis for HIV 03/14/2021   Screening for STDs (sexually transmitted diseases) 03/14/2021   Need for hepatitis C screening test 03/14/2021    Patient's Medications  New Prescriptions   No medications on file  Previous Medications   CABOTEGRAVIR ER (APRETUDE) 600 MG/3ML INJECTION    Inject 3 mLs (600 mg total) into the muscle every 2 (two) months.   NEOMYCIN-POLYMYXIN B-DEXAMETHASONE (MAXITROL) 3.5-10000-0.1 OINT    APPLY A SMALL AMOUNT ONTO SUTURES OR OPERATIVE SITE TWICE A DAY FOR THREE DAYS THEN STOP   PREDNISONE (DELTASONE) 10 MG TABLET    Start day after surgery - 6 tablets for 2 days, then 4 tablets for 2 days, then 2 tablets for 2 days, then 1 tablet for 3 days then 0.5 tablets for 3 days  Modified Medications   No medications on file  Discontinued Medications   No medications on file    Allergies: No Known Allergies  Past Medical History: Past Medical History:  Diagnosis Date   Anxiety    Depression    Known health problems: none     Social History: Social History   Socioeconomic History   Marital status: Single    Spouse name: Not on file   Number of children: Not on file   Years of education: Not on file   Highest education level: Not on file  Occupational History   Not on file  Tobacco Use   Smoking status: Light Smoker    Types: Cigarettes   Smokeless tobacco: Never  Vaping Use   Vaping Use: Never used  Substance and Sexual Activity   Alcohol use: Not Currently   Drug use: Yes    Types: Marijuana, IV   Sexual activity: Not on file  Other Topics Concern   Not on file  Social History Narrative   Not on file   Social Determinants of Health   Financial  Resource Strain: Not on file  Food Insecurity: Not on file  Transportation Needs: Not on file  Physical Activity: Not on file  Stress: Not on file  Social Connections: Not on file    Labs: Lab Results  Component Value Date   HIV1RNAQUANT Not Detected 10/02/2022   HIV1RNAQUANT Not Detected 08/06/2022   HIV1RNAQUANT Not Detected 06/05/2022    RPR and STI Lab Results  Component Value Date   LABRPR NON-REACTIVE 10/02/2022   LABRPR NON-REACTIVE 08/06/2022   LABRPR NON-REACTIVE 06/05/2022   LABRPR NON-REACTIVE 04/26/2022   LABRPR NON-REACTIVE 04/05/2022   RPRTITER 1:1 (H) 03/08/2022   RPRTITER 1:2 (H) 12/27/2021   RPRTITER 1:16 (H) 10/26/2021   RPRTITER 1:64 (H) 07/28/2021    STI Results GC CT  10/02/2022  2:15 PM Negative    Negative  Negative    Negative   08/06/2022  2:09 PM Negative    Negative  Negative    Negative   08/06/2022  1:53 PM Negative  Negative   06/05/2022  4:26 PM Negative  Negative   06/05/2022  4:17 PM Negative  Negative   04/26/2022  3:50 PM Negative    Negative    Negative  Negative    Negative    Negative   04/05/2022  3:14 PM Negative  Negative    Negative  Negative    Negative    Negative   12/27/2021  3:05 PM Negative    Negative    Negative  Negative    Negative    Negative   10/26/2021 12:17 PM Negative    Positive    Positive  Negative    Negative    Negative   07/28/2021 11:25 AM Negative    Negative  Negative    Negative   06/21/2021  3:09 PM Negative    Negative    Negative  Negative    Negative    Negative   03/14/2021 10:09 AM Negative    Negative    Negative  Negative    Negative    Negative   05/26/2020  2:52 PM Negative    Negative    Negative  Negative    Negative    Negative   11/10/2019  1:52 PM Negative    Negative  Negative    Negative   03/17/2019 12:00 AM Negative    Negative    Negative  Negative    Negative    Negative     Hepatitis B Lab Results  Component Value Date   HEPBSAB REACTIVE  (A) 03/17/2019   HEPBSAG NON-REACTIVE 03/17/2019   Hepatitis C Lab Results  Component Value Date   HEPCAB NON-REACTIVE 03/14/2021   Hepatitis A No results found for: "HAV" Lipids: No results found for: "CHOL", "TRIG", "HDL", "CHOLHDL", "VLDL", "[place]"  TARGET DATE: 16th of each month   Assessment: Dylan Hurst presents today for his Apretude injection and to follow up for HIV PrEP. No issues with past injections. Screened for acute HIV symptoms such as fatigue, muscle aches, rash, sore throat, lymphadenopathy, headache, night sweats, nausea/vomiting/diarrhea, and fever. Denies any symptoms. No known exposures to any STIs and no signs or symptoms of any STIs today. Last STI screening 10/02/22 was negative. He wishes to have STI screening done today. He was offered condoms during the visit, which were accepted.   Per [employer] guidelines, a rapid HIV test should be drawn prior to Apretude administration. Due to state shortage of rapid HIV tests, this is temporarily unable to be done. Per decision from [place], we will proceed with Apretude administration at this time without a negative rapid HIV test beforehand. HIV RNA was collected today and is in process.  Administered cabotegravir '600mg'$ /44m in right upper outer quadrant of the gluteal muscle. Injection was tolerated well without issue. Will see him back in 2 months for injection, labs, and HIV PrEP follow up.  States he thinks he has pain for up to 3 weeks following the injection, but is unsure of the exact timeline. He stated he would pay closer attention following this injection to see how long the pain lasts. States the pain isn't too bad, but noticeable and uncomfortable. States he does get some relief from APAP- states he typically starts APAP about a week after the injection. States he has not noted any nodules. He would like to continue receiving the injections, but informed him we can always switch to oral PrEP options if  needed. He voiced understanding.   Plan: - Apretude injection administered - HIV RNA today - follow-up urine, rectal, oral cytologies, RPR  - Next injection, labs, and PrEP follow up appointment scheduled for 02/08/23 with AEstill Bamberg - Call with any issues or questions  AAdria Dill PharmD PGY-2 Infectious Diseases Resident  12/11/2022 3:06 PM

## 2022-12-12 LAB — CYTOLOGY, (ORAL, ANAL, URETHRAL) ANCILLARY ONLY
Chlamydia: NEGATIVE
Chlamydia: NEGATIVE
Comment: NEGATIVE
Comment: NEGATIVE
Comment: NORMAL
Comment: NORMAL
Neisseria Gonorrhea: NEGATIVE
Neisseria Gonorrhea: NEGATIVE

## 2022-12-12 LAB — URINE CYTOLOGY ANCILLARY ONLY
Chlamydia: NEGATIVE
Comment: NEGATIVE
Comment: NORMAL
Neisseria Gonorrhea: NEGATIVE

## 2022-12-13 LAB — HIV-1 RNA QUANT-NO REFLEX-BLD
HIV 1 RNA Quant: NOT DETECTED Copies/mL
HIV-1 RNA Quant, Log: NOT DETECTED Log cps/mL

## 2022-12-13 LAB — RPR: RPR Ser Ql: NONREACTIVE

## 2023-01-22 ENCOUNTER — Other Ambulatory Visit (HOSPITAL_COMMUNITY): Payer: Self-pay

## 2023-01-22 ENCOUNTER — Telehealth: Payer: Self-pay

## 2023-01-22 NOTE — Telephone Encounter (Signed)
[facility] Patient Advocate Encounter  Completed and sent [employer] Patient Assistance application for Apretude for this patient who is uninsured.    Patient assistance phone number for follow up is [phone].   This encounter will be updated until final determination.   Lloyd Cullinan, CPhT Specialty Pharmacy Patient [facility] Phone: [phone] Fax:  [phone]  

## 2023-01-28 ENCOUNTER — Telehealth: Payer: Self-pay

## 2023-01-28 NOTE — Telephone Encounter (Signed)
[facility] Patient Advocate Encounter  Completed and sent [employer] Patient Assistance application for Apretude for this patient who is uninsured.    Patient is approved 01/24/23 through 01/24/24.  Medication will be shipped to the clinic.   Ileene Patrick, [place] [facility] Patient [facility] for Infectious Disease Phone: [phone] Fax:  [phone]

## 2023-02-07 ENCOUNTER — Telehealth: Payer: Self-pay

## 2023-02-07 NOTE — Telephone Encounter (Signed)
[facility] Patient Advocate Encounter  Patient's medication (Apretude) have been couriered to [facility] from [employer] and will be administered on the patient next office visit on 02/08/23.  Dylan Hurst , [place] [facility] Patient [name] for Infectious Disease Phone: [phone] Fax:  [phone]

## 2023-02-08 ENCOUNTER — Other Ambulatory Visit: Payer: Self-pay

## 2023-02-08 ENCOUNTER — Ambulatory Visit (INDEPENDENT_AMBULATORY_CARE_PROVIDER_SITE_OTHER): Payer: Self-pay | Admitting: Pharmacist

## 2023-02-08 DIAGNOSIS — Z79899 Other long term (current) drug therapy: Secondary | ICD-10-CM

## 2023-02-08 DIAGNOSIS — Z2981 Encounter for HIV pre-exposure prophylaxis: Secondary | ICD-10-CM

## 2023-02-08 MED ORDER — CABOTEGRAVIR ER 600 MG/3ML IM SUER
600.0000 mg | Freq: Once | INTRAMUSCULAR | Status: AC
  Administered 2023-02-08: 600 mg via INTRAMUSCULAR

## 2023-02-08 NOTE — Progress Notes (Signed)
HPI: Dylan Hurst is a 37 y.o. male who presents to the [place] clinic for Apretude administration and HIV PrEP follow up.  Patient Active Problem List   Diagnosis Date Noted   Generalized anxiety disorder 07/25/2021   History of substance use 07/25/2021   On pre-exposure prophylaxis for HIV 03/14/2021   Screening for STDs (sexually transmitted diseases) 03/14/2021   Need for hepatitis C screening test 03/14/2021    Patient's Medications  New Prescriptions   No medications on file  Previous Medications   CABOTEGRAVIR ER (APRETUDE) 600 MG/3ML INJECTION    Inject 3 mLs (600 mg total) into the muscle every 2 (two) months.   NEOMYCIN-POLYMYXIN B-DEXAMETHASONE (MAXITROL) 3.5-10000-0.1 OINT    APPLY A SMALL AMOUNT ONTO SUTURES OR OPERATIVE SITE TWICE A DAY FOR THREE DAYS THEN STOP   PREDNISONE (DELTASONE) 10 MG TABLET    Start day after surgery - 6 tablets for 2 days, then 4 tablets for 2 days, then 2 tablets for 2 days, then 1 tablet for 3 days then 0.5 tablets for 3 days  Modified Medications   No medications on file  Discontinued Medications   No medications on file    Allergies: No Known Allergies  Past Medical History: Past Medical History:  Diagnosis Date   Anxiety    Depression    Known health problems: none     Social History: Social History   Socioeconomic History   Marital status: Single    Spouse name: Not on file   Number of children: Not on file   Years of education: Not on file   Highest education level: Not on file  Occupational History   Not on file  Tobacco Use   Smoking status: Light Smoker    Types: Cigarettes   Smokeless tobacco: Never  Vaping Use   Vaping Use: Never used  Substance and Sexual Activity   Alcohol use: Not Currently   Drug use: Yes    Types: Marijuana, IV   Sexual activity: Not on file  Other Topics Concern   Not on file  Social History Narrative   Not on file   Social Determinants of Health   Financial  Resource Strain: Not on file  Food Insecurity: Not on file  Transportation Needs: Not on file  Physical Activity: Not on file  Stress: Not on file  Social Connections: Not on file    Labs: Lab Results  Component Value Date   HIV1RNAQUANT Not Detected 12/11/2022   HIV1RNAQUANT Not Detected 10/02/2022   HIV1RNAQUANT Not Detected 08/06/2022    RPR and STI Lab Results  Component Value Date   LABRPR NON-REACTIVE 12/11/2022   LABRPR NON-REACTIVE 10/02/2022   LABRPR NON-REACTIVE 08/06/2022   LABRPR NON-REACTIVE 06/05/2022   LABRPR NON-REACTIVE 04/26/2022   RPRTITER 1:1 (H) 03/08/2022   RPRTITER 1:2 (H) 12/27/2021   RPRTITER 1:16 (H) 10/26/2021   RPRTITER 1:64 (H) 07/28/2021    STI Results GC CT  12/11/2022  3:24 PM Negative    Negative    Negative  Negative    Negative    Negative   10/02/2022  2:15 PM Negative    Negative  Negative    Negative   08/06/2022  2:09 PM Negative    Negative  Negative    Negative   08/06/2022  1:53 PM Negative  Negative   06/05/2022  4:26 PM Negative  Negative   06/05/2022  4:17 PM Negative  Negative   04/26/2022  3:50 PM Negative  Negative    Negative  Negative    Negative    Negative   04/05/2022  3:14 PM Negative    Negative    Negative  Negative    Negative    Negative   12/27/2021  3:05 PM Negative    Negative    Negative  Negative    Negative    Negative   10/26/2021 12:17 PM Negative    Positive    Positive  Negative    Negative    Negative   07/28/2021 11:25 AM Negative    Negative  Negative    Negative   06/21/2021  3:09 PM Negative    Negative    Negative  Negative    Negative    Negative   03/14/2021 10:09 AM Negative    Negative    Negative  Negative    Negative    Negative   05/26/2020  2:52 PM Negative    Negative    Negative  Negative    Negative    Negative   11/10/2019  1:52 PM Negative    Negative  Negative    Negative   03/17/2019 12:00 AM Negative    Negative    Negative  Negative     Negative    Negative     Hepatitis B Lab Results  Component Value Date   HEPBSAB REACTIVE (A) 03/17/2019   HEPBSAG NON-REACTIVE 03/17/2019   Hepatitis C Lab Results  Component Value Date   HEPCAB NON-REACTIVE 03/14/2021   Hepatitis A No results found for: "HAV" Lipids: No results found for: "CHOL", "TRIG", "HDL", "CHOLHDL", "VLDL", "[place]"  TARGET DATE: 16th  Assessment: Dylan Hurst presents today for Apretude injection and to follow up for HIV PrEP. He reports significant pain starting a few days after each injection and persisting for about 1 week. I reinforced strategies to mitigate pain including exercising after a dose, using a heating pad or taking tylenol. He also brought up concerns about pain in his arm and wrist after playing soccer and wants to know if this could be related to his Apretude. Counseled that it is unlikely that this is related to his PrEP and he should follow up with his PCP for evaluation of his joint pain.   Due to the pain with injections he is unsure if he would like to continue with the injections or switch to an oral PrEP option moving forward. He agreed to get the injection today but would like to discuss oral options at his next appointment.   Screened for acute HIV symptoms such as fatigue, muscle aches, rash, sore throat, lymphadenopathy, headache, night sweats, nausea/vomiting/diarrhea, and fever. Denies any symptoms. No known exposures to any STIs since last visit. Politely declines STI testing today with RPR and oral/urine/rectal cytologies.   Per [employer] guidelines, a rapid HIV test should be drawn prior to Apretude administration. Due to state shortage of rapid HIV tests, this is temporarily unable to be done. Per decision from [place], we will proceed with Apretude administration at this time without a negative rapid HIV test beforehand. HIV RNA was collected today and is in process.  Administered cabotegravir 645m/3mL in right  upper outer quadrant of the gluteal muscle. Will see him back in 2 months for injection, labs, and HIV PrEP follow up.  Plan: - Apretude injection administered - HIV RNA today - Next injection, labs, and PrEP follow up appointment scheduled for 04/04/23 with Dylan Hurst - Call with any issues or questions  Dylan Hurst, PharmD PGY1 Pharmacy Resident 02/08/2023 9:47 AM

## 2023-02-10 LAB — HIV-1 RNA QUANT-NO REFLEX-BLD
HIV 1 RNA Quant: NOT DETECTED Copies/mL
HIV-1 RNA Quant, Log: NOT DETECTED Log cps/mL

## 2023-03-28 ENCOUNTER — Other Ambulatory Visit (HOSPITAL_COMMUNITY): Payer: Self-pay

## 2023-04-01 ENCOUNTER — Telehealth: Payer: Self-pay

## 2023-04-01 NOTE — Telephone Encounter (Signed)
[facility] Patient Advocate Encounter  Patient's medication (Apretude) have been couriered to [facility] from [employer] and will be administered on the patient next office visit on 04/04/23.  Clearance Coots , CPhT Specialty Pharmacy Patient [facility] for Infectious Disease Phone: [phone] Fax:  [phone]

## 2023-04-04 ENCOUNTER — Ambulatory Visit (INDEPENDENT_AMBULATORY_CARE_PROVIDER_SITE_OTHER): Payer: Self-pay | Admitting: Pharmacist

## 2023-04-04 ENCOUNTER — Other Ambulatory Visit: Payer: Self-pay

## 2023-04-04 DIAGNOSIS — Z79899 Other long term (current) drug therapy: Secondary | ICD-10-CM

## 2023-04-04 DIAGNOSIS — Z2981 Encounter for HIV pre-exposure prophylaxis: Secondary | ICD-10-CM

## 2023-04-04 DIAGNOSIS — Z113 Encounter for screening for infections with a predominantly sexual mode of transmission: Secondary | ICD-10-CM

## 2023-04-04 MED ORDER — CABOTEGRAVIR ER 600 MG/3ML IM SUER
600.0000 mg | Freq: Once | INTRAMUSCULAR | Status: AC
Start: 2023-04-04 — End: 2023-04-04
  Administered 2023-04-04: 600 mg via INTRAMUSCULAR

## 2023-04-04 NOTE — Progress Notes (Signed)
HPI: Dylan Hurst is a 37 y.o. male who presents to the [facility] pharmacy clinic for Apretude administration and HIV PrEP follow up.  Insured   [x]    Uninsured  []    Patient Active Problem List   Diagnosis Date Noted   Generalized anxiety disorder 07/25/2021   History of substance use 07/25/2021   On pre-exposure prophylaxis for HIV 03/14/2021   Screening for STDs (sexually transmitted diseases) 03/14/2021   Need for hepatitis C screening test 03/14/2021    Patient's Medications  New Prescriptions   No medications on file  Previous Medications   CABOTEGRAVIR ER (APRETUDE) 600 MG/3ML INJECTION    Inject 3 mLs (600 mg total) into the muscle every 2 (two) months.   NEOMYCIN-POLYMYXIN B-DEXAMETHASONE (MAXITROL) 3.5-10000-0.1 OINT    APPLY A SMALL AMOUNT ONTO SUTURES OR OPERATIVE SITE TWICE A DAY FOR THREE DAYS THEN STOP   PREDNISONE (DELTASONE) 10 MG TABLET    Start day after surgery - 6 tablets for 2 days, then 4 tablets for 2 days, then 2 tablets for 2 days, then 1 tablet for 3 days then 0.5 tablets for 3 days  Modified Medications   No medications on file  Discontinued Medications   No medications on file    Allergies: No Known Allergies  Past Medical History: Past Medical History:  Diagnosis Date   Anxiety    Depression    Known health problems: none     Social History: Social History   Socioeconomic History   Marital status: Single    Spouse name: Not on file   Number of children: Not on file   Years of education: Not on file   Highest education level: Not on file  Occupational History   Not on file  Tobacco Use   Smoking status: Light Smoker    Types: Cigarettes   Smokeless tobacco: Never  Vaping Use   Vaping Use: Never used  Substance and Sexual Activity   Alcohol use: Not Currently   Drug use: Yes    Types: Marijuana, IV   Sexual activity: Not on file  Other Topics Concern   Not on file  Social History Narrative   Not on file   Social  Determinants of Health   Financial Resource Strain: Not on file  Food Insecurity: Not on file  Transportation Needs: Not on file  Physical Activity: Not on file  Stress: Not on file  Social Connections: Not on file    Labs: Lab Results  Component Value Date   HIV1RNAQUANT Not Detected 02/08/2023   HIV1RNAQUANT Not Detected 12/11/2022   HIV1RNAQUANT Not Detected 10/02/2022    RPR and STI Lab Results  Component Value Date   LABRPR NON-REACTIVE 12/11/2022   LABRPR NON-REACTIVE 10/02/2022   LABRPR NON-REACTIVE 08/06/2022   LABRPR NON-REACTIVE 06/05/2022   LABRPR NON-REACTIVE 04/26/2022   RPRTITER 1:1 (H) 03/08/2022   RPRTITER 1:2 (H) 12/27/2021   RPRTITER 1:16 (H) 10/26/2021   RPRTITER 1:64 (H) 07/28/2021    STI Results GC CT  12/11/2022  3:24 PM Negative    Negative    Negative  Negative    Negative    Negative   10/02/2022  2:15 PM Negative    Negative  Negative    Negative   08/06/2022  2:09 PM Negative    Negative  Negative    Negative   08/06/2022  1:53 PM Negative  Negative   06/05/2022  4:26 PM Negative  Negative   06/05/2022  4:17 PM  Negative  Negative   04/26/2022  3:50 PM Negative    Negative    Negative  Negative    Negative    Negative   04/05/2022  3:14 PM Negative    Negative    Negative  Negative    Negative    Negative   12/27/2021  3:05 PM Negative    Negative    Negative  Negative    Negative    Negative   10/26/2021 12:17 PM Negative    Positive    Positive  Negative    Negative    Negative   07/28/2021 11:25 AM Negative    Negative  Negative    Negative   06/21/2021  3:09 PM Negative    Negative    Negative  Negative    Negative    Negative   03/14/2021 10:09 AM Negative    Negative    Negative  Negative    Negative    Negative   05/26/2020  2:52 PM Negative    Negative    Negative  Negative    Negative    Negative   11/10/2019  1:52 PM Negative    Negative  Negative    Negative   03/17/2019 12:00 AM Negative     Negative    Negative  Negative    Negative    Negative     Hepatitis B Lab Results  Component Value Date   HEPBSAB REACTIVE (A) 03/17/2019   HEPBSAG NON-REACTIVE 03/17/2019   Hepatitis C Lab Results  Component Value Date   HEPCAB NON-REACTIVE 03/14/2021   Hepatitis A No results found for: "HAV" Lipids: No results found for: "CHOL", "TRIG", "HDL", "CHOLHDL", "VLDL", "LDLCALC"  TARGET DATE: The 16th  Assessment: Kristjan presents today for his Apretude injection and to follow up for HIV PrEP. No issues with past injections. Screened for acute HIV symptoms such as fatigue, muscle aches, rash, sore throat, lymphadenopathy, headache, night sweats, nausea/vomiting/diarrhea, and fever. Denies any symptoms. No known exposures to any STIs since last visit. He agrees to full STI testing today with RPR and oral/urine/rectal cytologies.   Per [employer] guidelines, a rapid HIV test should be drawn prior to Apretude administration. Due to state shortage of rapid HIV tests, this is temporarily unable to be done. Per decision from [facility] physicians, we will proceed with Apretude administration at this time without a negative rapid HIV test beforehand. HIV RNA was collected today and is in process.  Administered cabotegravir 600mg /48mL in left upper outer quadrant of the gluteal muscle. Will see him back in 2 months for injection, labs, and HIV PrEP follow up.  He had previously considered switching from injectable to oral PrEP therapy due to concerns of pains in his wrists and legs and thought that was related to the Apretude. He has decided to continue with injectable therapy as the pains he is experiencing are more likely to be related to arthritis, per his PCP.    Plan: - Apretude injection administered - Follow up STI testing today with RPR and oral/urine/rectal cytologies - HIV RNA today - Next injection, labs, and PrEP follow up appointment scheduled for June 12th, 2024 - Call with any  issues or questions  Blane Ohara, PharmD  PGY1 Pharmacy Resident

## 2023-04-07 LAB — RPR: RPR Ser Ql: NONREACTIVE

## 2023-04-07 LAB — HIV-1 RNA QUANT-NO REFLEX-BLD
HIV 1 RNA Quant: NOT DETECTED Copies/mL
HIV-1 RNA Quant, Log: NOT DETECTED Log cps/mL

## 2023-04-09 LAB — URINE CYTOLOGY ANCILLARY ONLY
Chlamydia: NEGATIVE
Comment: NEGATIVE
Comment: NORMAL
Neisseria Gonorrhea: NEGATIVE

## 2023-04-09 LAB — CYTOLOGY, (ORAL, ANAL, URETHRAL) ANCILLARY ONLY
Chlamydia: NEGATIVE
Chlamydia: NEGATIVE
Comment: NEGATIVE
Comment: NEGATIVE
Comment: NORMAL
Comment: NORMAL
Neisseria Gonorrhea: NEGATIVE
Neisseria Gonorrhea: NEGATIVE

## 2023-05-23 ENCOUNTER — Telehealth: Payer: Self-pay

## 2023-05-23 NOTE — Telephone Encounter (Signed)
[facility] Patient Advocate Encounter  Patient's medication (Apretude) have been couriered to [facility] from [facility] and will be administered on the patient next office visit on 06/05/23.  Clearance Coots , CPhT Specialty Pharmacy Patient [facility] Phone: [phone] Fax:  [phone]

## 2023-06-05 ENCOUNTER — Other Ambulatory Visit: Payer: Self-pay

## 2023-06-05 ENCOUNTER — Ambulatory Visit (INDEPENDENT_AMBULATORY_CARE_PROVIDER_SITE_OTHER): Payer: Self-pay | Admitting: Pharmacist

## 2023-06-05 DIAGNOSIS — Z2981 Encounter for HIV pre-exposure prophylaxis: Secondary | ICD-10-CM

## 2023-06-05 DIAGNOSIS — Z79899 Other long term (current) drug therapy: Secondary | ICD-10-CM

## 2023-06-05 DIAGNOSIS — Z113 Encounter for screening for infections with a predominantly sexual mode of transmission: Secondary | ICD-10-CM

## 2023-06-05 MED ORDER — CABOTEGRAVIR ER 600 MG/3ML IM SUER
600.0000 mg | Freq: Once | INTRAMUSCULAR | Status: AC
Start: 2023-06-05 — End: 2023-06-05
  Administered 2023-06-05: 600 mg via INTRAMUSCULAR

## 2023-06-05 NOTE — Progress Notes (Signed)
HPI: Dylan Hurst is a 37 y.o. male who presents to the [facility] pharmacy clinic for Apretude administration and HIV PrEP follow up.  Insured   []    Uninsured  [x]    Patient Active Problem List   Diagnosis Date Noted   Generalized anxiety disorder 07/25/2021   History of substance use 07/25/2021   On pre-exposure prophylaxis for HIV 03/14/2021   Screening for STDs (sexually transmitted diseases) 03/14/2021   Need for hepatitis C screening test 03/14/2021    Patient's Medications  New Prescriptions   No medications on file  Previous Medications   CABOTEGRAVIR ER (APRETUDE) 600 MG/3ML INJECTION    Inject 3 mLs (600 mg total) into the muscle every 2 (two) months.   NEOMYCIN-POLYMYXIN B-DEXAMETHASONE (MAXITROL) 3.5-10000-0.1 OINT    APPLY A SMALL AMOUNT ONTO SUTURES OR OPERATIVE SITE TWICE A DAY FOR THREE DAYS THEN STOP   PREDNISONE (DELTASONE) 10 MG TABLET    Start day after surgery - 6 tablets for 2 days, then 4 tablets for 2 days, then 2 tablets for 2 days, then 1 tablet for 3 days then 0.5 tablets for 3 days  Modified Medications   No medications on file  Discontinued Medications   No medications on file    Allergies: No Known Allergies  Past Medical History: Past Medical History:  Diagnosis Date   Anxiety    Depression    Known health problems: none     Social History: Social History   Socioeconomic History   Marital status: Single    Spouse name: Not on file   Number of children: Not on file   Years of education: Not on file   Highest education level: Not on file  Occupational History   Not on file  Tobacco Use   Smoking status: Light Smoker    Types: Cigarettes   Smokeless tobacco: Never  Vaping Use   Vaping Use: Never used  Substance and Sexual Activity   Alcohol use: Not Currently   Drug use: Yes    Types: Marijuana, IV   Sexual activity: Not on file  Other Topics Concern   Not on file  Social History Narrative   Not on file   Social  Determinants of Health   Financial Resource Strain: Not on file  Food Insecurity: Not on file  Transportation Needs: Not on file  Physical Activity: Not on file  Stress: Not on file  Social Connections: Not on file    Labs: Lab Results  Component Value Date   HIV1RNAQUANT Not Detected 04/04/2023   HIV1RNAQUANT Not Detected 02/08/2023   HIV1RNAQUANT Not Detected 12/11/2022    RPR and STI Lab Results  Component Value Date   LABRPR NON-REACTIVE 04/04/2023   LABRPR NON-REACTIVE 12/11/2022   LABRPR NON-REACTIVE 10/02/2022   LABRPR NON-REACTIVE 08/06/2022   LABRPR NON-REACTIVE 06/05/2022   RPRTITER 1:1 (H) 03/08/2022   RPRTITER 1:2 (H) 12/27/2021   RPRTITER 1:16 (H) 10/26/2021   RPRTITER 1:64 (H) 07/28/2021    STI Results GC CT  04/04/2023  9:44 AM Negative    Negative    Negative  Negative    Negative    Negative   12/11/2022  3:24 PM Negative    Negative    Negative  Negative    Negative    Negative   10/02/2022  2:15 PM Negative    Negative  Negative    Negative   08/06/2022  2:09 PM Negative    Negative  Negative  Negative   08/06/2022  1:53 PM Negative  Negative   06/05/2022  4:26 PM Negative  Negative   06/05/2022  4:17 PM Negative  Negative   04/26/2022  3:50 PM Negative    Negative    Negative  Negative    Negative    Negative   04/05/2022  3:14 PM Negative    Negative    Negative  Negative    Negative    Negative   12/27/2021  3:05 PM Negative    Negative    Negative  Negative    Negative    Negative   10/26/2021 12:17 PM Negative    Positive    Positive  Negative    Negative    Negative   07/28/2021 11:25 AM Negative    Negative  Negative    Negative   06/21/2021  3:09 PM Negative    Negative    Negative  Negative    Negative    Negative   03/14/2021 10:09 AM Negative    Negative    Negative  Negative    Negative    Negative   05/26/2020  2:52 PM Negative    Negative    Negative  Negative    Negative    Negative    11/10/2019  1:52 PM Negative    Negative  Negative    Negative   03/17/2019 12:00 AM Negative    Negative    Negative  Negative    Negative    Negative     Hepatitis B Lab Results  Component Value Date   HEPBSAB REACTIVE (A) 03/17/2019   HEPBSAG NON-REACTIVE 03/17/2019   Hepatitis C Lab Results  Component Value Date   HEPCAB NON-REACTIVE 03/14/2021   Hepatitis A No results found for: "HAV" Lipids: No results found for: "CHOL", "TRIG", "HDL", "CHOLHDL", "VLDL", "LDLCALC"  TARGET DATE: The 16th  Assessment: Josue presents today for his Apretude injection and to follow up for HIV PrEP. No issues with past injections. Denies any symptoms of acute HIV. No new partners since last visit. Last STI screening was on 04/04/23 and was negative. No known exposures to any STIs since last visit. Agrees to full STI testing today with RPR and oral/urine/rectal cytologies.   Per [employer] guidelines, a rapid HIV test should be drawn prior to Apretude administration. Due to state shortage of rapid HIV tests, this is temporarily unable to be done. Per decision from [facility] physicians, we will proceed with Apretude administration at this time without a negative rapid HIV test beforehand. HIV RNA was collected today and is in process.  Administered cabotegravir 600mg /42mL in left upper outer quadrant of the gluteal muscle. Will see him back in 2 months for injection, labs, and HIV PrEP follow up.  Plan: - Apretude injection administered - HIV RNA, RPR, and urine/rectal/pharyngeal cytologies for GC/chlamydia today - Next injection, labs, and PrEP follow up appointment scheduled for 08/08/23 - Call with any issues or questions  Majd Tissue L. Quasim Doyon, PharmD, BCIDP, AAHIVP, CPP Clinical Pharmacist Practitioner Infectious Diseases Clinical Pharmacist [facility]

## 2023-06-06 LAB — CYTOLOGY, (ORAL, ANAL, URETHRAL) ANCILLARY ONLY
Chlamydia: NEGATIVE
Chlamydia: NEGATIVE
Comment: NEGATIVE
Comment: NEGATIVE
Comment: NORMAL
Comment: NORMAL
Neisseria Gonorrhea: NEGATIVE
Neisseria Gonorrhea: NEGATIVE

## 2023-06-06 LAB — URINE CYTOLOGY ANCILLARY ONLY
Chlamydia: NEGATIVE
Comment: NEGATIVE
Comment: NORMAL
Neisseria Gonorrhea: NEGATIVE

## 2023-06-07 LAB — RPR: RPR Ser Ql: NONREACTIVE

## 2023-06-07 LAB — HIV-1 RNA QUANT-NO REFLEX-BLD
HIV 1 RNA Quant: NOT DETECTED {copies}/mL
HIV-1 RNA Quant, Log: NOT DETECTED {Log_copies}/mL

## 2023-07-23 ENCOUNTER — Telehealth: Payer: Self-pay

## 2023-07-23 NOTE — Telephone Encounter (Signed)
[facility] Patient Advocate Encounter  Patient's medication (Apretude) have been couriered to [facility] from [facility] and will be administered on the patient next office visit on 08/08/23.  Clearance Coots , CPhT Specialty Pharmacy Patient [facility] for Infectious Disease Phone: [phone] Fax:  [phone]

## 2023-08-08 ENCOUNTER — Other Ambulatory Visit: Payer: Self-pay

## 2023-08-08 ENCOUNTER — Ambulatory Visit (INDEPENDENT_AMBULATORY_CARE_PROVIDER_SITE_OTHER): Payer: Self-pay | Admitting: Pharmacist

## 2023-08-08 ENCOUNTER — Other Ambulatory Visit (HOSPITAL_COMMUNITY): Payer: Self-pay

## 2023-08-08 DIAGNOSIS — Z2981 Encounter for HIV pre-exposure prophylaxis: Secondary | ICD-10-CM

## 2023-08-08 DIAGNOSIS — Z79899 Other long term (current) drug therapy: Secondary | ICD-10-CM

## 2023-08-08 DIAGNOSIS — Z113 Encounter for screening for infections with a predominantly sexual mode of transmission: Secondary | ICD-10-CM

## 2023-08-08 MED ORDER — CABOTEGRAVIR ER 600 MG/3ML IM SUER
600.0000 mg | Freq: Once | INTRAMUSCULAR | Status: AC
Start: 2023-08-08 — End: 2023-08-08
  Administered 2023-08-08: 600 mg via INTRAMUSCULAR

## 2023-08-08 NOTE — Progress Notes (Signed)
HPI: Dylan Hurst is a 37 y.o. male who presents to the [facility] pharmacy clinic for Apretude administration and HIV PrEP follow up.  Insured   []    Uninsured  [x]    Patient Active Problem List   Diagnosis Date Noted   Generalized anxiety disorder 07/25/2021   History of substance use 07/25/2021   On pre-exposure prophylaxis for HIV 03/14/2021   Screening for STDs (sexually transmitted diseases) 03/14/2021   Need for hepatitis C screening test 03/14/2021    Patient's Medications  New Prescriptions   No medications on file  Previous Medications   CABOTEGRAVIR ER (APRETUDE) 600 MG/3ML INJECTION    Inject 3 mLs (600 mg total) into the muscle every 2 (two) months.   NEOMYCIN-POLYMYXIN B-DEXAMETHASONE (MAXITROL) 3.5-10000-0.1 OINT    APPLY A SMALL AMOUNT ONTO SUTURES OR OPERATIVE SITE TWICE A DAY FOR THREE DAYS THEN STOP   PREDNISONE (DELTASONE) 10 MG TABLET    Start day after surgery - 6 tablets for 2 days, then 4 tablets for 2 days, then 2 tablets for 2 days, then 1 tablet for 3 days then 0.5 tablets for 3 days  Modified Medications   No medications on file  Discontinued Medications   No medications on file    Allergies: No Known Allergies  Labs: Lab Results  Component Value Date   HIV1RNAQUANT Not Detected 06/05/2023   HIV1RNAQUANT Not Detected 04/04/2023   HIV1RNAQUANT Not Detected 02/08/2023    RPR and STI Lab Results  Component Value Date   LABRPR NON-REACTIVE 06/05/2023   LABRPR NON-REACTIVE 04/04/2023   LABRPR NON-REACTIVE 12/11/2022   LABRPR NON-REACTIVE 10/02/2022   LABRPR NON-REACTIVE 08/06/2022   RPRTITER 1:1 (H) 03/08/2022   RPRTITER 1:2 (H) 12/27/2021   RPRTITER 1:16 (H) 10/26/2021   RPRTITER 1:64 (H) 07/28/2021    STI Results GC CT  06/05/2023 10:06 AM Negative    Negative    Negative  Negative    Negative    Negative   04/04/2023  9:44 AM Negative    Negative    Negative  Negative    Negative    Negative   12/11/2022  3:24 PM  Negative    Negative    Negative  Negative    Negative    Negative   10/02/2022  2:15 PM Negative    Negative  Negative    Negative   08/06/2022  2:09 PM Negative    Negative  Negative    Negative   08/06/2022  1:53 PM Negative  Negative   06/05/2022  4:26 PM Negative  Negative   06/05/2022  4:17 PM Negative  Negative   04/26/2022  3:50 PM Negative    Negative    Negative  Negative    Negative    Negative   04/05/2022  3:14 PM Negative    Negative    Negative  Negative    Negative    Negative   12/27/2021  3:05 PM Negative    Negative    Negative  Negative    Negative    Negative   10/26/2021 12:17 PM Negative    Positive    Positive  Negative    Negative    Negative   07/28/2021 11:25 AM Negative    Negative  Negative    Negative   06/21/2021  3:09 PM Negative    Negative    Negative  Negative    Negative    Negative   03/14/2021 10:09 AM Negative  Negative    Negative  Negative    Negative    Negative   05/26/2020  2:52 PM Negative    Negative    Negative  Negative    Negative    Negative   11/10/2019  1:52 PM Negative    Negative  Negative    Negative   03/17/2019 12:00 AM Negative    Negative    Negative  Negative    Negative    Negative     Hepatitis B Lab Results  Component Value Date   HEPBSAB REACTIVE (A) 03/17/2019   HEPBSAG NON-REACTIVE 03/17/2019   Hepatitis C Lab Results  Component Value Date   HEPCAB NON-REACTIVE 03/14/2021   Hepatitis A No results found for: "HAV" Lipids: No results found for: "CHOL", "TRIG", "HDL", "CHOLHDL", "VLDL", "LDLCALC"  TARGET DATE: The 16th of the month  Assessment: Linkyn presents today for his Apretude injection and to follow up for HIV PrEP. He continues to report pain at injection site for 4-5 days after injection. Despite this, he wants to remain on Apretude. Denies any symptoms of acute HIV. Last STI screening was on 06/05/23 and was negative. No known exposures to any STIs since last  visit. Agrees to full STI testing today with RPR and oral/urine/rectal cytologies.   Per [employer] guidelines, a rapid HIV test should be drawn prior to Apretude administration. Due to state shortage of rapid HIV tests, this is temporarily unable to be done. Per decision from [facility] physicians, we will proceed with Apretude administration at this time without a negative rapid HIV test beforehand. HIV RNA was collected today and is in process.  Administered cabotegravir 600mg /67mL in right upper outer quadrant of the gluteal muscle. Will see him back in 2 months for injection, labs, and HIV PrEP follow up.  He did let me know that his work permit was approved and is now able to apply for Medicaid. Patient does live in [place], but we do not think that [place] Medicaid will work here. We will continue to use patient assistance for him so that he can continue to receive care here at the clinic.  Plan: - Apretude injection administered - HIV RNA, RPR, oral/urine/rectal cytologies for GC/chlamydia today - Next injection, labs, and PrEP follow up appointment scheduled for 10/9 and 12/10 with Cassie - Call with any issues or questions  Lennie Muckle, PharmD PGY1 Pharmacy Resident 08/08/2023 9:19 AM

## 2023-08-09 ENCOUNTER — Encounter: Payer: Self-pay | Admitting: Pharmacist

## 2023-08-09 LAB — URINE CYTOLOGY ANCILLARY ONLY
Chlamydia: NEGATIVE
Comment: NEGATIVE
Comment: NORMAL
Neisseria Gonorrhea: NEGATIVE

## 2023-08-09 LAB — CYTOLOGY, (ORAL, ANAL, URETHRAL) ANCILLARY ONLY
Chlamydia: POSITIVE — AB
Comment: NEGATIVE
Comment: NORMAL
Neisseria Gonorrhea: NEGATIVE

## 2023-08-12 ENCOUNTER — Encounter: Payer: Self-pay | Admitting: Pharmacist

## 2023-08-12 ENCOUNTER — Other Ambulatory Visit (HOSPITAL_COMMUNITY): Payer: Self-pay

## 2023-08-12 ENCOUNTER — Other Ambulatory Visit: Payer: Self-pay

## 2023-08-12 ENCOUNTER — Other Ambulatory Visit: Payer: Self-pay | Admitting: Pharmacist

## 2023-08-12 LAB — CYTOLOGY, (ORAL, ANAL, URETHRAL) ANCILLARY ONLY
Chlamydia: NEGATIVE
Comment: NEGATIVE
Comment: NORMAL
Neisseria Gonorrhea: NEGATIVE

## 2023-08-12 LAB — RPR: RPR Ser Ql: NONREACTIVE

## 2023-08-12 LAB — HIV-1 RNA QUANT-NO REFLEX-BLD
HIV 1 RNA Quant: NOT DETECTED {copies}/mL
HIV-1 RNA Quant, Log: NOT DETECTED {Log_copies}/mL

## 2023-08-12 MED ORDER — DOXYCYCLINE HYCLATE 100 MG PO TABS
100.0000 mg | ORAL_TABLET | Freq: Two times a day (BID) | ORAL | 0 refills | Status: AC
  Filled 2023-08-12 (×2): qty 14, 7d supply, fill #0

## 2023-09-05 ENCOUNTER — Telehealth: Payer: Self-pay | Admitting: Pharmacy Technician

## 2023-09-05 NOTE — Telephone Encounter (Signed)
Nilo from [employer] called to find out pt's next apt, to be able to ship med (Apretude). She will have it processed. 9/25 to arrive here on 9/26.

## 2023-09-19 ENCOUNTER — Telehealth: Payer: Self-pay | Admitting: Pharmacy Technician

## 2023-09-19 NOTE — Telephone Encounter (Signed)
[facility] Patient Advocate Encounter  Patient's medication, Apretude, has been delivered to [facility] from [facility] pharmacy and will be administered at pt's next visit 10/02/23.

## 2023-09-30 NOTE — Progress Notes (Deleted)
HPI: Dylan Hurst is a 37 y.o. male who presents to the [facility] pharmacy clinic for Apretude administration and HIV PrEP follow up.  Insured   []    Uninsured  []    Patient Active Problem List   Diagnosis Date Noted   Generalized anxiety disorder 07/25/2021   History of substance use 07/25/2021   On pre-exposure prophylaxis for HIV 03/14/2021   Screening for STDs (sexually transmitted diseases) 03/14/2021   Need for hepatitis C screening test 03/14/2021    Patient's Medications  New Prescriptions   No medications on file  Previous Medications   CABOTEGRAVIR ER (APRETUDE) 600 MG/3ML INJECTION    Inject 3 mLs (600 mg total) into the muscle every 2 (two) months.   NEOMYCIN-POLYMYXIN B-DEXAMETHASONE (MAXITROL) 3.5-10000-0.1 OINT    APPLY A SMALL AMOUNT ONTO SUTURES OR OPERATIVE SITE TWICE A DAY FOR THREE DAYS THEN STOP   PREDNISONE (DELTASONE) 10 MG TABLET    Start day after surgery - 6 tablets for 2 days, then 4 tablets for 2 days, then 2 tablets for 2 days, then 1 tablet for 3 days then 0.5 tablets for 3 days  Modified Medications   No medications on file  Discontinued Medications   No medications on file    Allergies: No Known Allergies  Labs: Lab Results  Component Value Date   HIV1RNAQUANT Not Detected 08/08/2023   HIV1RNAQUANT Not Detected 06/05/2023   HIV1RNAQUANT Not Detected 04/04/2023    RPR and STI Lab Results  Component Value Date   LABRPR NON-REACTIVE 08/08/2023   LABRPR NON-REACTIVE 06/05/2023   LABRPR NON-REACTIVE 04/04/2023   LABRPR NON-REACTIVE 12/11/2022   LABRPR NON-REACTIVE 10/02/2022   RPRTITER 1:1 (H) 03/08/2022   RPRTITER 1:2 (H) 12/27/2021   RPRTITER 1:16 (H) 10/26/2021   RPRTITER 1:64 (H) 07/28/2021    STI Results GC CT  08/08/2023 10:07 AM Negative    Negative    Negative  Positive    Negative    Negative   06/05/2023 10:06 AM Negative    Negative    Negative  Negative    Negative    Negative   04/04/2023  9:44 AM  Negative    Negative    Negative  Negative    Negative    Negative   12/11/2022  3:24 PM Negative    Negative    Negative  Negative    Negative    Negative   10/02/2022  2:15 PM Negative    Negative  Negative    Negative   08/06/2022  2:09 PM Negative    Negative  Negative    Negative   08/06/2022  1:53 PM Negative  Negative   06/05/2022  4:26 PM Negative  Negative   06/05/2022  4:17 PM Negative  Negative   04/26/2022  3:50 PM Negative    Negative    Negative  Negative    Negative    Negative   04/05/2022  3:14 PM Negative    Negative    Negative  Negative    Negative    Negative   12/27/2021  3:05 PM Negative    Negative    Negative  Negative    Negative    Negative   10/26/2021 12:17 PM Negative    Positive    Positive  Negative    Negative    Negative   07/28/2021 11:25 AM Negative    Negative  Negative    Negative   06/21/2021  3:09 PM Negative  Negative    Negative  Negative    Negative    Negative   03/14/2021 10:09 AM Negative    Negative    Negative  Negative    Negative    Negative   05/26/2020  2:52 PM Negative    Negative    Negative  Negative    Negative    Negative   11/10/2019  1:52 PM Negative    Negative  Negative    Negative   03/17/2019 12:00 AM Negative    Negative    Negative  Negative    Negative    Negative     Hepatitis B Lab Results  Component Value Date   HEPBSAB REACTIVE (A) 03/17/2019   HEPBSAG NON-REACTIVE 03/17/2019   Hepatitis C Lab Results  Component Value Date   HEPCAB NON-REACTIVE 03/14/2021   Hepatitis A No results found for: "HAV" Lipids: No results found for: "CHOL", "TRIG", "HDL", "CHOLHDL", "VLDL", "LDLCALC"  TARGET DATE: The 16th  Assessment: Jathniel presents today for his Apretude injection and to follow up for HIV PrEP. No issues with past injections. Denies any symptoms of acute HIV. Last STI screening was on 08/08/23 and was positive for chlamydia. He was treated with doxycycline. No  known exposures to any STIs since last visit. ***Agrees to full STI testing today with RPR and oral/urine/rectal cytologies.   Per [employer] guidelines, a rapid HIV test should be drawn prior to Apretude administration. Due to state shortage of rapid HIV tests, this is temporarily unable to be done. Per decision from [facility] physicians, we will proceed with Apretude administration at this time without a negative rapid HIV test beforehand. HIV RNA was collected today and is in process.  Administered cabotegravir 600mg /7mL in *** upper outer quadrant of the gluteal muscle. Will see him back in 2 months for injection, labs, and HIV PrEP follow up.  Plan: - Apretude injection administered - HIV RNA, RPR, and urine/rectal/pharyngeal cytologies for GC/chlamydia today - Next injection, labs, and PrEP follow up appointment scheduled for *** - Call with any issues or questions  Shy Guallpa L. Lois Slagel, PharmD, BCIDP, AAHIVP, CPP Clinical Pharmacist Practitioner Infectious Diseases Clinical Pharmacist [facility]

## 2023-10-02 ENCOUNTER — Ambulatory Visit: Payer: No Typology Code available for payment source | Admitting: Pharmacist

## 2023-10-02 DIAGNOSIS — Z79899 Other long term (current) drug therapy: Secondary | ICD-10-CM

## 2023-10-02 DIAGNOSIS — Z113 Encounter for screening for infections with a predominantly sexual mode of transmission: Secondary | ICD-10-CM

## 2023-10-04 NOTE — Progress Notes (Deleted)
HPI: Dylan Hurst is a 37 y.o. male who presents to the [facility] pharmacy clinic for Apretude administration and HIV PrEP follow up.  Insured   []    Uninsured  [x]    Patient Active Problem List   Diagnosis Date Noted   Generalized anxiety disorder 07/25/2021   History of substance use 07/25/2021   On pre-exposure prophylaxis for HIV 03/14/2021   Screening for STDs (sexually transmitted diseases) 03/14/2021   Need for hepatitis C screening test 03/14/2021    Patient's Medications  New Prescriptions   No medications on file  Previous Medications   CABOTEGRAVIR ER (APRETUDE) 600 MG/3ML INJECTION    Inject 3 mLs (600 mg total) into the muscle every 2 (two) months.   NEOMYCIN-POLYMYXIN B-DEXAMETHASONE (MAXITROL) 3.5-10000-0.1 OINT    APPLY A SMALL AMOUNT ONTO SUTURES OR OPERATIVE SITE TWICE A DAY FOR THREE DAYS THEN STOP   PREDNISONE (DELTASONE) 10 MG TABLET    Start day after surgery - 6 tablets for 2 days, then 4 tablets for 2 days, then 2 tablets for 2 days, then 1 tablet for 3 days then 0.5 tablets for 3 days  Modified Medications   No medications on file  Discontinued Medications   No medications on file    Allergies: No Known Allergies  Labs: Lab Results  Component Value Date   HIV1RNAQUANT Not Detected 08/08/2023   HIV1RNAQUANT Not Detected 06/05/2023   HIV1RNAQUANT Not Detected 04/04/2023    RPR and STI Lab Results  Component Value Date   LABRPR NON-REACTIVE 08/08/2023   LABRPR NON-REACTIVE 06/05/2023   LABRPR NON-REACTIVE 04/04/2023   LABRPR NON-REACTIVE 12/11/2022   LABRPR NON-REACTIVE 10/02/2022   RPRTITER 1:1 (H) 03/08/2022   RPRTITER 1:2 (H) 12/27/2021   RPRTITER 1:16 (H) 10/26/2021   RPRTITER 1:64 (H) 07/28/2021    STI Results GC CT  08/08/2023 10:07 AM Negative    Negative    Negative  Positive    Negative    Negative   06/05/2023 10:06 AM Negative    Negative    Negative  Negative    Negative    Negative   04/04/2023  9:44 AM  Negative    Negative    Negative  Negative    Negative    Negative   12/11/2022  3:24 PM Negative    Negative    Negative  Negative    Negative    Negative   10/02/2022  2:15 PM Negative    Negative  Negative    Negative   08/06/2022  2:09 PM Negative    Negative  Negative    Negative   08/06/2022  1:53 PM Negative  Negative   06/05/2022  4:26 PM Negative  Negative   06/05/2022  4:17 PM Negative  Negative   04/26/2022  3:50 PM Negative    Negative    Negative  Negative    Negative    Negative   04/05/2022  3:14 PM Negative    Negative    Negative  Negative    Negative    Negative   12/27/2021  3:05 PM Negative    Negative    Negative  Negative    Negative    Negative   10/26/2021 12:17 PM Negative    Positive    Positive  Negative    Negative    Negative   07/28/2021 11:25 AM Negative    Negative  Negative    Negative   06/21/2021  3:09 PM Negative  Negative    Negative  Negative    Negative    Negative   03/14/2021 10:09 AM Negative    Negative    Negative  Negative    Negative    Negative   05/26/2020  2:52 PM Negative    Negative    Negative  Negative    Negative    Negative   11/10/2019  1:52 PM Negative    Negative  Negative    Negative   03/17/2019 12:00 AM Negative    Negative    Negative  Negative    Negative    Negative     Hepatitis B Lab Results  Component Value Date   HEPBSAB REACTIVE (A) 03/17/2019   HEPBSAG NON-REACTIVE 03/17/2019   Hepatitis C Lab Results  Component Value Date   HEPCAB NON-REACTIVE 03/14/2021   Hepatitis A No results found for: "HAV" Lipids: No results found for: "CHOL", "TRIG", "HDL", "CHOLHDL", "VLDL", "LDLCALC"  TARGET DATE: The 16th  Assessment: Dylan Hurst presents today for his Apretude injection and to follow up for HIV PrEP. No issues with past injections. Denies any symptoms of acute HIV. Last STI screening was on 08/08/23 and was positive for chlamydia. He was treated with doxycycline. No  known exposures to any STIs since last visit. Agrees to full STI testing today with RPR and oral/urine/rectal cytologies.   Per [employer] guidelines, a rapid HIV test should be drawn prior to Apretude administration. Due to state shortage of rapid HIV tests, this is temporarily unable to be done. Per decision from [facility] physicians, we will proceed with Apretude administration at this time without a negative rapid HIV test beforehand. HIV RNA was collected today and is in process.  Administered cabotegravir 600mg /75mL in *** upper outer quadrant of the gluteal muscle. Will see him back in 2 months for injection, labs, and HIV PrEP follow up.  Plan: - Apretude injection administered - HIV RNA, RPR, and urine/rectal/pharyngeal cytologies for GC/chlamydia today - Next injection, labs, and PrEP follow up appointment scheduled for 12/03/23 with me - Call with any issues or questions  Dylan Hurst, PharmD, BCIDP, AAHIVP, CPP Clinical Pharmacist Practitioner Infectious Diseases Clinical Pharmacist [facility]

## 2023-10-07 ENCOUNTER — Ambulatory Visit: Payer: No Typology Code available for payment source | Admitting: Pharmacist

## 2023-10-07 DIAGNOSIS — Z79899 Other long term (current) drug therapy: Secondary | ICD-10-CM

## 2023-10-07 DIAGNOSIS — Z113 Encounter for screening for infections with a predominantly sexual mode of transmission: Secondary | ICD-10-CM

## 2023-10-07 NOTE — Progress Notes (Unsigned)
HPI: Dylan Hurst is a 37 y.o. male who presents to the [facility] pharmacy clinic for Apretude administration and HIV PrEP follow up.  Insured   []    Uninsured  [x]    Patient Active Problem List   Diagnosis Date Noted   Generalized anxiety disorder 07/25/2021   History of substance use 07/25/2021   On pre-exposure prophylaxis for HIV 03/14/2021   Screening for STDs (sexually transmitted diseases) 03/14/2021   Need for hepatitis C screening test 03/14/2021    Patient's Medications  New Prescriptions   No medications on file  Previous Medications   CABOTEGRAVIR ER (APRETUDE) 600 MG/3ML INJECTION    Inject 3 mLs (600 mg total) into the muscle every 2 (two) months.   NEOMYCIN-POLYMYXIN B-DEXAMETHASONE (MAXITROL) 3.5-10000-0.1 OINT    APPLY A SMALL AMOUNT ONTO SUTURES OR OPERATIVE SITE TWICE A DAY FOR THREE DAYS THEN STOP   PREDNISONE (DELTASONE) 10 MG TABLET    Start day after surgery - 6 tablets for 2 days, then 4 tablets for 2 days, then 2 tablets for 2 days, then 1 tablet for 3 days then 0.5 tablets for 3 days  Modified Medications   No medications on file  Discontinued Medications   No medications on file    Allergies: No Known Allergies  Labs: Lab Results  Component Value Date   HIV1RNAQUANT Not Detected 08/08/2023   HIV1RNAQUANT Not Detected 06/05/2023   HIV1RNAQUANT Not Detected 04/04/2023    RPR and STI Lab Results  Component Value Date   LABRPR NON-REACTIVE 08/08/2023   LABRPR NON-REACTIVE 06/05/2023   LABRPR NON-REACTIVE 04/04/2023   LABRPR NON-REACTIVE 12/11/2022   LABRPR NON-REACTIVE 10/02/2022   RPRTITER 1:1 (H) 03/08/2022   RPRTITER 1:2 (H) 12/27/2021   RPRTITER 1:16 (H) 10/26/2021   RPRTITER 1:64 (H) 07/28/2021    STI Results GC CT  08/08/2023 10:07 AM Negative    Negative    Negative  Positive    Negative    Negative   06/05/2023 10:06 AM Negative    Negative    Negative  Negative    Negative    Negative   04/04/2023  9:44 AM  Negative    Negative    Negative  Negative    Negative    Negative   12/11/2022  3:24 PM Negative    Negative    Negative  Negative    Negative    Negative   10/02/2022  2:15 PM Negative    Negative  Negative    Negative   08/06/2022  2:09 PM Negative    Negative  Negative    Negative   08/06/2022  1:53 PM Negative  Negative   06/05/2022  4:26 PM Negative  Negative   06/05/2022  4:17 PM Negative  Negative   04/26/2022  3:50 PM Negative    Negative    Negative  Negative    Negative    Negative   04/05/2022  3:14 PM Negative    Negative    Negative  Negative    Negative    Negative   12/27/2021  3:05 PM Negative    Negative    Negative  Negative    Negative    Negative   10/26/2021 12:17 PM Negative    Positive    Positive  Negative    Negative    Negative   07/28/2021 11:25 AM Negative    Negative  Negative    Negative   06/21/2021  3:09 PM Negative  Negative    Negative  Negative    Negative    Negative   03/14/2021 10:09 AM Negative    Negative    Negative  Negative    Negative    Negative   05/26/2020  2:52 PM Negative    Negative    Negative  Negative    Negative    Negative   11/10/2019  1:52 PM Negative    Negative  Negative    Negative   03/17/2019 12:00 AM Negative    Negative    Negative  Negative    Negative    Negative     Hepatitis B Lab Results  Component Value Date   HEPBSAB REACTIVE (A) 03/17/2019   HEPBSAG NON-REACTIVE 03/17/2019   Hepatitis C Lab Results  Component Value Date   HEPCAB NON-REACTIVE 03/14/2021   Hepatitis A No results found for: "HAV" Lipids: No results found for: "CHOL", "TRIG", "HDL", "CHOLHDL", "VLDL", "LDLCALC"  TARGET DATE: The 16th of the month  Assessment: [name] presents today for their Apretude injection and to follow up for HIV PrEP. No issues with past injections. Denies any symptoms of acute HIV. Last STI screening was on 08/08/23 and was positive for chlamydia. He is status post 7  days of doxycycline. He reports no new concerns for STI. No known exposures to any STIs since last visit. He agrees to full STI testing today with RPR and oral/urine/rectal cytologies.   Per [employer] guidelines, a rapid HIV test should be drawn prior to Apretude administration. Due to state shortage of rapid HIV tests, this is temporarily unable to be done. Per decision from [facility] physicians, we will proceed with Apretude administration at this time without a negative rapid HIV test beforehand. HIV RNA was collected today and is in process.  Administered cabotegravir 600mg /53mL in L upper outer quadrant of the gluteal muscle. Will see Morey back in 2 months for injection, labs, and HIV PrEP follow up.  Plan: - Apretude injection administered - HIV RNA, RPR, oral/urine/rectal cytologies for GC/chlamydia today  - Next injection, labs, and PrEP follow up appointment scheduled for 12/03/23 with Cassie - Call with any issues or questions  Lora Paula, PharmD PGY-2 Infectious Diseases Pharmacy Resident 10/07/2023 10:09 PM

## 2023-10-08 ENCOUNTER — Ambulatory Visit (INDEPENDENT_AMBULATORY_CARE_PROVIDER_SITE_OTHER): Payer: Self-pay | Admitting: Pharmacist

## 2023-10-08 ENCOUNTER — Other Ambulatory Visit: Payer: Self-pay

## 2023-10-08 DIAGNOSIS — Z2981 Encounter for HIV pre-exposure prophylaxis: Secondary | ICD-10-CM

## 2023-10-08 MED ORDER — CABOTEGRAVIR ER 600 MG/3ML IM SUER
600.0000 mg | Freq: Once | INTRAMUSCULAR | Status: AC
Start: 2023-10-08 — End: 2023-10-08
  Administered 2023-10-08: 600 mg via INTRAMUSCULAR

## 2023-10-09 LAB — URINE CYTOLOGY ANCILLARY ONLY
Chlamydia: NEGATIVE
Comment: NEGATIVE
Comment: NORMAL
Neisseria Gonorrhea: NEGATIVE

## 2023-10-09 LAB — CYTOLOGY, (ORAL, ANAL, URETHRAL) ANCILLARY ONLY
Chlamydia: NEGATIVE
Chlamydia: NEGATIVE
Comment: NEGATIVE
Comment: NEGATIVE
Comment: NORMAL
Comment: NORMAL
Neisseria Gonorrhea: NEGATIVE
Neisseria Gonorrhea: NEGATIVE

## 2023-10-11 LAB — HIV-1 RNA QUANT-NO REFLEX-BLD
HIV 1 RNA Quant: NOT DETECTED {copies}/mL
HIV-1 RNA Quant, Log: NOT DETECTED {Log}

## 2023-10-11 LAB — RPR: RPR Ser Ql: NONREACTIVE

## 2023-11-14 ENCOUNTER — Telehealth: Payer: Self-pay

## 2023-11-14 NOTE — Telephone Encounter (Signed)
[facility] Patient Advocate Encounter  Patient's medications APRETUDE have been couriered to [facility] from [facility] and will be administered at the patients appointment on 12/03/23.  Kae Heller, CPhT Specialty Pharmacy Patient [facility] for [facility] Phone: [phone] Fax:  [phone]

## 2023-12-02 NOTE — Progress Notes (Unsigned)
HPI: Dylan Hurst is a 37 y.o. male who presents to the [facility] pharmacy clinic for Apretude administration and HIV PrEP follow up.  Insured   []    Uninsured  [x]    Patient Active Problem List   Diagnosis Date Noted   Generalized anxiety disorder 07/25/2021   History of substance use 07/25/2021   On pre-exposure prophylaxis for HIV 03/14/2021   Screening for STDs (sexually transmitted diseases) 03/14/2021   Need for hepatitis C screening test 03/14/2021    Patient's Medications  New Prescriptions   No medications on file  Previous Medications   CABOTEGRAVIR ER (APRETUDE) 600 MG/3ML INJECTION    Inject 3 mLs (600 mg total) into the muscle every 2 (two) months.   NEOMYCIN-POLYMYXIN B-DEXAMETHASONE (MAXITROL) 3.5-10000-0.1 OINT    APPLY A SMALL AMOUNT ONTO SUTURES OR OPERATIVE SITE TWICE A DAY FOR THREE DAYS THEN STOP   PREDNISONE (DELTASONE) 10 MG TABLET    Start day after surgery - 6 tablets for 2 days, then 4 tablets for 2 days, then 2 tablets for 2 days, then 1 tablet for 3 days then 0.5 tablets for 3 days  Modified Medications   No medications on file  Discontinued Medications   No medications on file    Allergies: No Known Allergies  Labs: Lab Results  Component Value Date   HIV1RNAQUANT Not Detected 10/08/2023   HIV1RNAQUANT Not Detected 08/08/2023   HIV1RNAQUANT Not Detected 06/05/2023    RPR and STI Lab Results  Component Value Date   LABRPR NON-REACTIVE 10/08/2023   LABRPR NON-REACTIVE 08/08/2023   LABRPR NON-REACTIVE 06/05/2023   LABRPR NON-REACTIVE 04/04/2023   LABRPR NON-REACTIVE 12/11/2022   RPRTITER 1:1 (H) 03/08/2022   RPRTITER 1:2 (H) 12/27/2021   RPRTITER 1:16 (H) 10/26/2021   RPRTITER 1:64 (H) 07/28/2021    STI Results GC CT  10/08/2023  3:05 PM Negative    Negative    Negative  Negative    Negative    Negative   08/08/2023 10:07 AM Negative    Negative    Negative  Positive    Negative    Negative   06/05/2023 10:06 AM  Negative    Negative    Negative  Negative    Negative    Negative   04/04/2023  9:44 AM Negative    Negative    Negative  Negative    Negative    Negative   12/11/2022  3:24 PM Negative    Negative    Negative  Negative    Negative    Negative   10/02/2022  2:15 PM Negative    Negative  Negative    Negative   08/06/2022  2:09 PM Negative    Negative  Negative    Negative   08/06/2022  1:53 PM Negative  Negative   06/05/2022  4:26 PM Negative  Negative   06/05/2022  4:17 PM Negative  Negative   04/26/2022  3:50 PM Negative    Negative    Negative  Negative    Negative    Negative   04/05/2022  3:14 PM Negative    Negative    Negative  Negative    Negative    Negative   12/27/2021  3:05 PM Negative    Negative    Negative  Negative    Negative    Negative   10/26/2021 12:17 PM Negative    Positive    Positive  Negative    Negative    Negative  07/28/2021 11:25 AM Negative    Negative  Negative    Negative   06/21/2021  3:09 PM Negative    Negative    Negative  Negative    Negative    Negative   03/14/2021 10:09 AM Negative    Negative    Negative  Negative    Negative    Negative   05/26/2020  2:52 PM Negative    Negative    Negative  Negative    Negative    Negative   11/10/2019  1:52 PM Negative    Negative  Negative    Negative   03/17/2019 12:00 AM Negative    Negative    Negative  Negative    Negative    Negative     Hepatitis B Lab Results  Component Value Date   HEPBSAB REACTIVE (A) 03/17/2019   HEPBSAG NON-REACTIVE 03/17/2019   Hepatitis C Lab Results  Component Value Date   HEPCAB NON-REACTIVE 03/14/2021   Hepatitis A No results found for: "HAV" Lipids: No results found for: "CHOL", "TRIG", "HDL", "CHOLHDL", "VLDL", "LDLCALC"  TARGET DATE: The 16th  Assessment: Dylan Hurst presents today for his Apretude injection and to follow up for HIV PrEP. No issues with past injections. Denies any symptoms of acute HIV. Last STI  screening was on 10/07/24 and was negative. No known exposures to any STIs since last visit. Agrees to full STI testing today with RPR and oral/urine/rectal cytologies.   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 He will be getting insurance through his job and it will be active around February. Asked him to bring his card in at the next visit.  He is very excited about this opportunity.  Per [employer] guidelines, a rapid HIV test should be drawn prior to Apretude administration. Due to state shortage of rapid HIV tests, this is temporarily unable to be done. Per decision from [facility] physicians, we will proceed with Apretude administration at this time without a negative rapid HIV test beforehand. HIV RNA was collected today and is in process.  Administered cabotegravir 600mg /108mL in left upper outer quadrant of the gluteal muscle. Will see him back in 2 months for injection, labs, and HIV PrEP follow up.  Plan: - Apretude injection administered - HIV RNA, RPR, and urine/rectal/pharyngeal cytologies for GC/chlamydia today - Next injection, labs, and PrEP follow up appointment scheduled for 02/03/24 - Call with any issues or questions  Dylan Hurst, PharmD, BCIDP, AAHIVP, CPP Clinical Pharmacist Practitioner Infectious Diseases Clinical Pharmacist [facility]

## 2023-12-03 ENCOUNTER — Other Ambulatory Visit: Payer: Self-pay

## 2023-12-03 ENCOUNTER — Ambulatory Visit (INDEPENDENT_AMBULATORY_CARE_PROVIDER_SITE_OTHER): Payer: Self-pay | Admitting: Pharmacist

## 2023-12-03 DIAGNOSIS — Z113 Encounter for screening for infections with a predominantly sexual mode of transmission: Secondary | ICD-10-CM

## 2023-12-03 DIAGNOSIS — Z2981 Encounter for HIV pre-exposure prophylaxis: Secondary | ICD-10-CM

## 2023-12-03 MED ORDER — CABOTEGRAVIR ER 600 MG/3ML IM SUER
600.0000 mg | Freq: Once | INTRAMUSCULAR | Status: AC
  Administered 2023-12-03: 600 mg via INTRAMUSCULAR

## 2023-12-04 LAB — URINE CYTOLOGY ANCILLARY ONLY
Chlamydia: NEGATIVE
Comment: NEGATIVE
Comment: NORMAL
Neisseria Gonorrhea: NEGATIVE

## 2023-12-04 LAB — CYTOLOGY, (ORAL, ANAL, URETHRAL) ANCILLARY ONLY
Chlamydia: NEGATIVE
Chlamydia: NEGATIVE
Comment: NEGATIVE
Comment: NEGATIVE
Comment: NORMAL
Comment: NORMAL
Neisseria Gonorrhea: NEGATIVE
Neisseria Gonorrhea: NEGATIVE

## 2023-12-06 ENCOUNTER — Other Ambulatory Visit (HOSPITAL_COMMUNITY): Payer: Self-pay

## 2023-12-06 LAB — HIV-1 RNA QUANT-NO REFLEX-BLD
HIV 1 RNA Quant: NOT DETECTED {copies}/mL
HIV-1 RNA Quant, Log: NOT DETECTED {Log}

## 2023-12-06 LAB — RPR: RPR Ser Ql: NONREACTIVE

## 2023-12-23 ENCOUNTER — Other Ambulatory Visit (HOSPITAL_COMMUNITY): Payer: Self-pay

## 2024-01-06 ENCOUNTER — Telehealth: Payer: Self-pay

## 2024-01-06 NOTE — Telephone Encounter (Signed)
 [facility] Patient Advocate Encounter  Patient's medications APRETUDE  have been couriered to [facility] from [facility] and will be administered at the patients appointment on 02/03/24.  Came 01/03/24  Charmaine Sharps, CPhT Specialty Pharmacy Patient [facility] Phone: [phone] Fax:  [phone]

## 2024-01-09 ENCOUNTER — Other Ambulatory Visit (HOSPITAL_COMMUNITY): Payer: Self-pay

## 2024-02-03 ENCOUNTER — Ambulatory Visit: Payer: Self-pay | Admitting: Pharmacist

## 2024-02-03 ENCOUNTER — Other Ambulatory Visit: Payer: Self-pay

## 2024-02-03 ENCOUNTER — Other Ambulatory Visit (HOSPITAL_COMMUNITY): Payer: Self-pay

## 2024-02-03 DIAGNOSIS — Z2981 Encounter for HIV pre-exposure prophylaxis: Secondary | ICD-10-CM

## 2024-02-03 DIAGNOSIS — Z113 Encounter for screening for infections with a predominantly sexual mode of transmission: Secondary | ICD-10-CM

## 2024-02-03 MED ORDER — CABOTEGRAVIR ER 600 MG/3ML IM SUER
600.0000 mg | Freq: Once | INTRAMUSCULAR | Status: AC
  Administered 2024-02-03: 600 mg via INTRAMUSCULAR

## 2024-02-03 NOTE — Progress Notes (Signed)
HPI: Dylan Hurst is a 38 y.o. male who presents to the [facility] pharmacy clinic for Apretude administration and HIV PrEP follow up.  Insured   []    Uninsured  [x]    Patient Active Problem List   Diagnosis Date Noted   Generalized anxiety disorder 07/25/2021   History of substance use 07/25/2021   On pre-exposure prophylaxis for HIV 03/14/2021   Screening for STDs (sexually transmitted diseases) 03/14/2021   Need for hepatitis C screening test 03/14/2021    Patient's Medications  New Prescriptions   No medications on file  Previous Medications   CABOTEGRAVIR ER (APRETUDE) 600 MG/3ML INJECTION    Inject 3 mLs (600 mg total) into the muscle every 2 (two) months.   NEOMYCIN-POLYMYXIN B-DEXAMETHASONE (MAXITROL) 3.5-10000-0.1 OINT    APPLY A SMALL AMOUNT ONTO SUTURES OR OPERATIVE SITE TWICE A DAY FOR THREE DAYS THEN STOP   PREDNISONE (DELTASONE) 10 MG TABLET    Start day after surgery - 6 tablets for 2 days, then 4 tablets for 2 days, then 2 tablets for 2 days, then 1 tablet for 3 days then 0.5 tablets for 3 days  Modified Medications   No medications on file  Discontinued Medications   No medications on file    Allergies: No Known Allergies  Labs: Lab Results  Component Value Date   HIV1RNAQUANT Not Detected 12/03/2023   HIV1RNAQUANT Not Detected 10/08/2023   HIV1RNAQUANT Not Detected 08/08/2023    RPR and STI Lab Results  Component Value Date   LABRPR NON-REACTIVE 12/03/2023   LABRPR NON-REACTIVE 10/08/2023   LABRPR NON-REACTIVE 08/08/2023   LABRPR NON-REACTIVE 06/05/2023   LABRPR NON-REACTIVE 04/04/2023   RPRTITER 1:1 (H) 03/08/2022   RPRTITER 1:2 (H) 12/27/2021   RPRTITER 1:16 (H) 10/26/2021   RPRTITER 1:64 (H) 07/28/2021    STI Results GC CT  12/03/2023 10:59 AM Negative    Negative    Negative  Negative    Negative    Negative   10/08/2023  3:05 PM Negative    Negative    Negative  Negative    Negative    Negative   08/08/2023 10:07 AM  Negative    Negative    Negative  Positive    Negative    Negative   06/05/2023 10:06 AM Negative    Negative    Negative  Negative    Negative    Negative   04/04/2023  9:44 AM Negative    Negative    Negative  Negative    Negative    Negative   12/11/2022  3:24 PM Negative    Negative    Negative  Negative    Negative    Negative   10/02/2022  2:15 PM Negative    Negative  Negative    Negative   08/06/2022  2:09 PM Negative    Negative  Negative    Negative   08/06/2022  1:53 PM Negative  Negative   06/05/2022  4:26 PM Negative  Negative   06/05/2022  4:17 PM Negative  Negative   04/26/2022  3:50 PM Negative    Negative    Negative  Negative    Negative    Negative   04/05/2022  3:14 PM Negative    Negative    Negative  Negative    Negative    Negative   12/27/2021  3:05 PM Negative    Negative    Negative  Negative    Negative    Negative  10/26/2021 12:17 PM Negative    Positive    Positive  Negative    Negative    Negative   07/28/2021 11:25 AM Negative    Negative  Negative    Negative   06/21/2021  3:09 PM Negative    Negative    Negative  Negative    Negative    Negative   03/14/2021 10:09 AM Negative    Negative    Negative  Negative    Negative    Negative   05/26/2020  2:52 PM Negative    Negative    Negative  Negative    Negative    Negative   11/10/2019  1:52 PM Negative    Negative  Negative    Negative     Hepatitis B Lab Results  Component Value Date   HEPBSAB REACTIVE (A) 03/17/2019   HEPBSAG NON-REACTIVE 03/17/2019   Hepatitis C Lab Results  Component Value Date   HEPCAB NON-REACTIVE 03/14/2021   Hepatitis A No results found for: "HAV" Lipids: No results found for: "CHOL", "TRIG", "HDL", "CHOLHDL", "VLDL", "LDLCALC"  TARGET DATE: March 30, 2024  Assessment: Faysal presents today for his Apretude injection and to follow up for HIV PrEP. No issues with past injections except for some residual soreness that  usually lasts for about 2 days. Denies any symptoms of acute HIV. Last STI screening was on 12/03/23 and was negative. No known exposures to any STIs since last visit. He agrees to STI testing today. Will check HIV RNA today. The patient started a new job at the [employer]. He mentioned today that he should be able to apply for insurance sometime this month.   Per [employer] guidelines, a rapid HIV test should be drawn prior to Apretude administration. Due to state shortage of rapid HIV tests, this is temporarily unable to be done. Per decision from [facility] physicians, we will proceed with Apretude administration at this time without a negative rapid HIV test beforehand. HIV RNA was collected today and is in process.  Administered cabotegravir 600mg /57mL in left upper outer quadrant of the gluteal muscle. Will see him back in 2 months for injection, labs, and HIV PrEP follow up.  Plan: - Apretude injection administered - HIV RNA today - Hep A Ab today - STI test (oral/rectal swab) and urine today - Next injection, labs, and PrEP follow up appointment scheduled for 04/06/2024. - Call with any issues or questions  Buddy Duty, PharmD Student [facility] for Infectious Disease

## 2024-02-04 LAB — URINE CYTOLOGY ANCILLARY ONLY
Chlamydia: NEGATIVE
Comment: NEGATIVE
Comment: NORMAL
Neisseria Gonorrhea: NEGATIVE

## 2024-02-04 LAB — CYTOLOGY, (ORAL, ANAL, URETHRAL) ANCILLARY ONLY
Chlamydia: NEGATIVE
Chlamydia: NEGATIVE
Comment: NEGATIVE
Comment: NEGATIVE
Comment: NORMAL
Comment: NORMAL
Neisseria Gonorrhea: NEGATIVE
Neisseria Gonorrhea: NEGATIVE

## 2024-02-05 ENCOUNTER — Encounter: Payer: Self-pay | Admitting: Pharmacist

## 2024-02-05 LAB — HIV-1 RNA QUANT-NO REFLEX-BLD
HIV 1 RNA Quant: NOT DETECTED {copies}/mL
HIV-1 RNA Quant, Log: NOT DETECTED {Log}

## 2024-02-05 LAB — RPR: RPR Ser Ql: NONREACTIVE

## 2024-02-05 LAB — HEPATITIS A ANTIBODY, TOTAL: Hepatitis A AB,Total: REACTIVE — AB

## 2024-02-10 ENCOUNTER — Other Ambulatory Visit (HOSPITAL_COMMUNITY): Payer: Self-pay

## 2024-03-05 ENCOUNTER — Other Ambulatory Visit (HOSPITAL_COMMUNITY): Payer: Self-pay

## 2024-03-24 ENCOUNTER — Other Ambulatory Visit (HOSPITAL_COMMUNITY): Payer: Self-pay

## 2024-04-05 NOTE — Progress Notes (Unsigned)
 HPI: Dylan Hurst is a 38 y.o. male who presents to the [facility] pharmacy clinic for Apretude administration and HIV PrEP follow up.  Insured   []    Uninsured  [x]    Patient Active Problem List   Diagnosis Date Noted   Generalized anxiety disorder 07/25/2021   History of substance use 07/25/2021   On pre-exposure prophylaxis for HIV 03/14/2021   Screening for STDs (sexually transmitted diseases) 03/14/2021   Need for hepatitis C screening test 03/14/2021    Patient's Medications  New Prescriptions   No medications on file  Previous Medications   CABOTEGRAVIR ER (APRETUDE) 600 MG/3ML INJECTION    Inject 3 mLs (600 mg total) into the muscle every 2 (two) months.   NEOMYCIN-POLYMYXIN B-DEXAMETHASONE (MAXITROL) 3.5-10000-0.1 OINT    APPLY A SMALL AMOUNT ONTO SUTURES OR OPERATIVE SITE TWICE A DAY FOR THREE DAYS THEN STOP   PREDNISONE (DELTASONE) 10 MG TABLET    Start day after surgery - 6 tablets for 2 days, then 4 tablets for 2 days, then 2 tablets for 2 days, then 1 tablet for 3 days then 0.5 tablets for 3 days  Modified Medications   No medications on file  Discontinued Medications   No medications on file    Allergies: No Known Allergies  Labs: Lab Results  Component Value Date   HIV1RNAQUANT Not Detected 02/03/2024   HIV1RNAQUANT Not Detected 12/03/2023   HIV1RNAQUANT Not Detected 10/08/2023    RPR and STI Lab Results  Component Value Date   LABRPR NON-REACTIVE 02/03/2024   LABRPR NON-REACTIVE 12/03/2023   LABRPR NON-REACTIVE 10/08/2023   LABRPR NON-REACTIVE 08/08/2023   LABRPR NON-REACTIVE 06/05/2023   RPRTITER 1:1 (H) 03/08/2022   RPRTITER 1:2 (H) 12/27/2021   RPRTITER 1:16 (H) 10/26/2021   RPRTITER 1:64 (H) 07/28/2021    STI Results GC CT  02/03/2024 11:23 AM Negative    Negative    Negative  Negative    Negative    Negative   12/03/2023 10:59 AM Negative    Negative    Negative  Negative    Negative    Negative   10/08/2023  3:05 PM  Negative    Negative    Negative  Negative    Negative    Negative   08/08/2023 10:07 AM Negative    Negative    Negative  Positive    Negative    Negative   06/05/2023 10:06 AM Negative    Negative    Negative  Negative    Negative    Negative   04/04/2023  9:44 AM Negative    Negative    Negative  Negative    Negative    Negative   12/11/2022  3:24 PM Negative    Negative    Negative  Negative    Negative    Negative   10/02/2022  2:15 PM Negative    Negative  Negative    Negative   08/06/2022  2:09 PM Negative    Negative  Negative    Negative   08/06/2022  1:53 PM Negative  Negative   06/05/2022  4:26 PM Negative  Negative   06/05/2022  4:17 PM Negative  Negative   04/26/2022  3:50 PM Negative    Negative    Negative  Negative    Negative    Negative   04/05/2022  3:14 PM Negative    Negative    Negative  Negative    Negative    Negative  12/27/2021  3:05 PM Negative    Negative    Negative  Negative    Negative    Negative   10/26/2021 12:17 PM Negative    Positive    Positive  Negative    Negative    Negative   07/28/2021 11:25 AM Negative    Negative  Negative    Negative   06/21/2021  3:09 PM Negative    Negative    Negative  Negative    Negative    Negative   03/14/2021 10:09 AM Negative    Negative    Negative  Negative    Negative    Negative   05/26/2020  2:52 PM Negative    Negative    Negative  Negative    Negative    Negative     Hepatitis B Lab Results  Component Value Date   HEPBSAB REACTIVE (A) 03/17/2019   HEPBSAG NON-REACTIVE 03/17/2019   Hepatitis C Lab Results  Component Value Date   HEPCAB NON-REACTIVE 03/14/2021   Hepatitis A Lab Results  Component Value Date   HAV REACTIVE (A) 02/03/2024   Lipids: No results found for: "CHOL", "TRIG", "HDL", "CHOLHDL", "VLDL", "LDLCALC"  TARGET DATE: 16th  Assessment: Dylan Hurst presents today for 2 month Apretude injection and to follow up for HIV PrEP. No issues  with past injections. Denies any symptoms of acute HIV. Last STI screening was on 01/2024 and was negative. No known exposures to any STIs since last visit. ***Agrees to STI testing today.    Per [employer] guidelines, a rapid HIV test should be drawn prior to Apretude administration. Due to state shortage of rapid HIV tests, this is temporarily unable to be done. Per decision from [facility] physicians, we will proceed with Apretude administration at this time without a negative rapid HIV test beforehand. HIV RNA was collected today and is in process.  Administered cabotegravir 600mg /10mL in *** upper outer quadrant of the gluteal muscle. Will see *** back in 2 months for injection, labs, and HIV PrEP follow up.  Plan: - Apretude injection administered - HIV RNA today - Next injection, labs, and PrEP follow up appointment scheduled for *** - Call with any issues or questions

## 2024-04-06 ENCOUNTER — Ambulatory Visit: Payer: Self-pay | Admitting: Pharmacist
